# Patient Record
Sex: Female | Born: 1935 | Race: White | Hispanic: No | Smoking: Never smoker
Health system: Southern US, Community
[De-identification: ages and names within clinical notes are randomized; demographics above are authoritative.]

## PROBLEM LIST (undated history)

## (undated) DIAGNOSIS — R001 Bradycardia, unspecified: Secondary | ICD-10-CM

## (undated) DIAGNOSIS — I2510 Atherosclerotic heart disease of native coronary artery without angina pectoris: Secondary | ICD-10-CM

## (undated) DIAGNOSIS — M1990 Unspecified osteoarthritis, unspecified site: Secondary | ICD-10-CM

## (undated) DIAGNOSIS — IMO0002 Reserved for concepts with insufficient information to code with codable children: Secondary | ICD-10-CM

## (undated) DIAGNOSIS — Z87440 Personal history of urinary (tract) infections: Secondary | ICD-10-CM

## (undated) DIAGNOSIS — K279 Peptic ulcer, site unspecified, unspecified as acute or chronic, without hemorrhage or perforation: Secondary | ICD-10-CM

## (undated) DIAGNOSIS — R9430 Abnormal result of cardiovascular function study, unspecified: Secondary | ICD-10-CM

## (undated) DIAGNOSIS — C50919 Malignant neoplasm of unspecified site of unspecified female breast: Secondary | ICD-10-CM

## (undated) DIAGNOSIS — I10 Essential (primary) hypertension: Secondary | ICD-10-CM

## (undated) DIAGNOSIS — I219 Acute myocardial infarction, unspecified: Secondary | ICD-10-CM

## (undated) DIAGNOSIS — E785 Hyperlipidemia, unspecified: Secondary | ICD-10-CM

## (undated) DIAGNOSIS — K219 Gastro-esophageal reflux disease without esophagitis: Secondary | ICD-10-CM

## (undated) DIAGNOSIS — R0602 Shortness of breath: Secondary | ICD-10-CM

## (undated) DIAGNOSIS — Z9861 Coronary angioplasty status: Secondary | ICD-10-CM

## (undated) DIAGNOSIS — C801 Malignant (primary) neoplasm, unspecified: Secondary | ICD-10-CM

## (undated) DIAGNOSIS — Z8711 Personal history of peptic ulcer disease: Secondary | ICD-10-CM

## (undated) HISTORY — DX: Abnormal result of cardiovascular function study, unspecified: R94.30

## (undated) HISTORY — PX: CHOLECYSTECTOMY: SHX55

## (undated) HISTORY — DX: Atherosclerotic heart disease of native coronary artery without angina pectoris: I25.10

## (undated) HISTORY — DX: Unspecified osteoarthritis, unspecified site: M19.90

## (undated) HISTORY — DX: Peptic ulcer, site unspecified, unspecified as acute or chronic, without hemorrhage or perforation: K27.9

## (undated) HISTORY — DX: Essential (primary) hypertension: I10

## (undated) HISTORY — DX: Reserved for concepts with insufficient information to code with codable children: IMO0002

## (undated) HISTORY — DX: Bradycardia, unspecified: R00.1

## (undated) HISTORY — DX: Hyperlipidemia, unspecified: E78.5

---

## 1973-05-08 HISTORY — PX: THYROIDECTOMY: SHX17

## 1996-05-08 DIAGNOSIS — Z9861 Coronary angioplasty status: Secondary | ICD-10-CM

## 1996-05-08 DIAGNOSIS — I219 Acute myocardial infarction, unspecified: Secondary | ICD-10-CM

## 1996-05-08 HISTORY — PX: CARDIAC CATHETERIZATION: SHX172

## 1996-05-08 HISTORY — DX: Coronary angioplasty status: Z98.61

## 1996-05-08 HISTORY — DX: Acute myocardial infarction, unspecified: I21.9

## 1997-10-30 ENCOUNTER — Ambulatory Visit (HOSPITAL_COMMUNITY): Admission: RE | Admit: 1997-10-30 | Discharge: 1997-10-30 | Payer: Self-pay | Admitting: *Deleted

## 1998-04-20 ENCOUNTER — Ambulatory Visit (HOSPITAL_COMMUNITY): Admission: RE | Admit: 1998-04-20 | Discharge: 1998-04-20 | Payer: Self-pay | Admitting: *Deleted

## 1998-10-15 ENCOUNTER — Ambulatory Visit (HOSPITAL_COMMUNITY): Admission: RE | Admit: 1998-10-15 | Discharge: 1998-10-15 | Payer: Self-pay | Admitting: Obstetrics & Gynecology

## 1999-04-22 ENCOUNTER — Ambulatory Visit (HOSPITAL_COMMUNITY): Admission: RE | Admit: 1999-04-22 | Discharge: 1999-04-22 | Payer: Self-pay | Admitting: Obstetrics & Gynecology

## 1999-10-21 ENCOUNTER — Ambulatory Visit (HOSPITAL_COMMUNITY): Admission: RE | Admit: 1999-10-21 | Discharge: 1999-10-21 | Payer: Self-pay | Admitting: Obstetrics & Gynecology

## 2000-11-23 ENCOUNTER — Ambulatory Visit (HOSPITAL_COMMUNITY): Admission: RE | Admit: 2000-11-23 | Discharge: 2000-11-23 | Payer: Self-pay

## 2001-12-27 ENCOUNTER — Ambulatory Visit (HOSPITAL_COMMUNITY): Admission: RE | Admit: 2001-12-27 | Discharge: 2001-12-27 | Payer: Self-pay | Admitting: *Deleted

## 2003-01-16 ENCOUNTER — Ambulatory Visit (HOSPITAL_COMMUNITY): Admission: RE | Admit: 2003-01-16 | Discharge: 2003-01-16 | Payer: Self-pay | Admitting: *Deleted

## 2004-03-15 ENCOUNTER — Ambulatory Visit: Payer: Self-pay | Admitting: Family Medicine

## 2004-04-20 ENCOUNTER — Ambulatory Visit: Payer: Self-pay | Admitting: Family Medicine

## 2004-04-22 ENCOUNTER — Ambulatory Visit: Payer: Self-pay | Admitting: Cardiology

## 2004-04-26 ENCOUNTER — Ambulatory Visit: Payer: Self-pay | Admitting: Family Medicine

## 2004-05-04 ENCOUNTER — Ambulatory Visit: Payer: Self-pay

## 2004-05-04 ENCOUNTER — Encounter: Payer: Self-pay | Admitting: Cardiology

## 2004-05-08 HISTORY — PX: OTHER SURGICAL HISTORY: SHX169

## 2004-05-08 HISTORY — PX: FRACTURE SURGERY: SHX138

## 2004-06-28 ENCOUNTER — Ambulatory Visit: Payer: Self-pay | Admitting: Cardiology

## 2005-03-01 ENCOUNTER — Ambulatory Visit: Payer: Self-pay | Admitting: Family Medicine

## 2005-03-03 ENCOUNTER — Ambulatory Visit: Payer: Self-pay | Admitting: Family Medicine

## 2005-04-19 ENCOUNTER — Ambulatory Visit: Payer: Self-pay | Admitting: Family Medicine

## 2005-04-25 ENCOUNTER — Ambulatory Visit: Payer: Self-pay | Admitting: Cardiology

## 2005-05-23 ENCOUNTER — Ambulatory Visit: Payer: Self-pay | Admitting: Family Medicine

## 2006-04-27 ENCOUNTER — Ambulatory Visit: Payer: Self-pay | Admitting: Cardiology

## 2007-04-17 ENCOUNTER — Ambulatory Visit: Payer: Self-pay | Admitting: Cardiology

## 2008-04-27 ENCOUNTER — Ambulatory Visit: Payer: Self-pay | Admitting: Cardiology

## 2010-09-20 NOTE — Assessment & Plan Note (Signed)
[facility]                            CARDIOLOGY OFFICE NOTE   NAME:LEVINERArmanii, Urbanik                      MRN:          [id number]  DATE:04/27/2008                            DOB:          15-Jan-1935    Ms. Dirocco continues to do well.  She has known coronary disease.  She  had an MI with an intervention in 1998.  She has been stable since then.  She had an exercise test in 2005, with no marked abnormalities.  Originally, she had pain in her left arm and shoulder.  She is not  having any of this.  She has some discomfort in her right shoulder at  times, but this sounds musculoskeletal.  She has not had any syncope or  presyncope.   PAST MEDICAL HISTORY:   ALLERGIES:  No known drug allergies.   MEDICATIONS:  See the flow sheet.   REVIEW OF SYSTEMS:  She is not having any GI or GU symptoms.  She has no  fevers or chills or headaches.  Her review of systems otherwise is  negative.   PHYSICAL EXAMINATION:  VITAL SIGNS:  Blood pressure is 124/74 with the  pulse of 50.  The patient is oriented to person, time, and place.  Affect is normal.  HEENT:  No xanthelasma.  She has normal extraocular motion.  There are  no carotid bruits.  There is no jugular venous distention.  LUNGS:  Clear.  Respiratory effort is not labored.  CARDIAC:  S1 with an S2.  There are no clicks or significant murmurs.  ABDOMEN:  Soft.  EXTREMITIES:  She has no peripheral edema.   EKG reveals sinus bradycardia.  I have reviewed it completely.   PROBLEMS:  Listed on my note of April 17, 2007.  #2.  Coronary disease stable.  There will be time to do an exercise test  when we see her next year.  #5.  Good left ventricular function.  #8.  Hyperlipidemia, followed by Dr. Sol Passer.  She is on high-dose  Lipitor.   She is stable.  I will see her back in 1 year.     Luis Abed, MD, [facility]  Electronically Signed   JDK/MedQ  DD: 04/27/2008  DT: 04/27/2008  Job #:  [id number]   cc:   Dina Rich
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
## 2011-01-27 ENCOUNTER — Encounter: Payer: Self-pay | Admitting: Cardiology

## 2011-01-27 ENCOUNTER — Encounter: Payer: Self-pay | Admitting: *Deleted

## 2011-01-27 DIAGNOSIS — R001 Bradycardia, unspecified: Secondary | ICD-10-CM | POA: Insufficient documentation

## 2011-01-27 DIAGNOSIS — E785 Hyperlipidemia, unspecified: Secondary | ICD-10-CM | POA: Insufficient documentation

## 2011-01-27 DIAGNOSIS — J45909 Unspecified asthma, uncomplicated: Secondary | ICD-10-CM | POA: Insufficient documentation

## 2011-01-30 ENCOUNTER — Encounter: Payer: Self-pay | Admitting: Cardiology

## 2011-01-30 ENCOUNTER — Ambulatory Visit (INDEPENDENT_AMBULATORY_CARE_PROVIDER_SITE_OTHER): Payer: Medicare Other | Admitting: Cardiology

## 2011-01-30 VITALS — BP 160/98 | HR 55 | Ht 61.0 in | Wt 139.0 lb

## 2011-01-30 DIAGNOSIS — R001 Bradycardia, unspecified: Secondary | ICD-10-CM

## 2011-01-30 DIAGNOSIS — E785 Hyperlipidemia, unspecified: Secondary | ICD-10-CM

## 2011-01-30 DIAGNOSIS — I2510 Atherosclerotic heart disease of native coronary artery without angina pectoris: Secondary | ICD-10-CM

## 2011-01-30 DIAGNOSIS — I10 Essential (primary) hypertension: Secondary | ICD-10-CM

## 2011-01-30 DIAGNOSIS — I498 Other specified cardiac arrhythmias: Secondary | ICD-10-CM

## 2011-01-30 MED ORDER — LISINOPRIL 10 MG PO TABS: 10.0000 mg | ORAL_TABLET | Freq: Every day | ORAL | Status: DC

## 2011-01-30 NOTE — Assessment & Plan Note (Signed)
Patient has mild resting bradycardia.  No change in therapy.

## 2011-01-30 NOTE — Assessment & Plan Note (Signed)
Blood pressure is elevated in the office today.  I personally repeated the blood pressure did remain the same in the range of 160/85.  She had been on lisinopril in the past and tolerated this well.  I feel it would be prudent for her to be back on 10 mg of lisinopril and this will be started.
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
## 2011-01-30 NOTE — Assessment & Plan Note (Signed)
Patient has known coronary disease with an intervention in 1998.  Nuclear exercise test in 2005 revealed no ischemia.  It is now 7 years since her last stress study.  I have arranged for a stress echo.  She says that she can walk on the treadmill.  Hopefully we will get both exercise data and LV function will be helpful.

## 2011-01-30 NOTE — Patient Instructions (Signed)
Your physician recommends that you schedule a follow-up appointment in: 5 weeks with Dr. Myrtis Ser. New medication Lisinopril 10 mg take one tablet by mouth daily. Your physician has requested that you have a stress echocardiogram. For further information please visit [email or web address] Please follow instruction sheet as given.

## 2011-01-30 NOTE — Assessment & Plan Note (Signed)
I have a copy of the patient's most recent lipids.  Her total cholesterol was 178.  LDL was 94 and HDL 72.  This is on Pravachol.  Since she has documented coronary disease consideration could be given to pushing her meds prior to get her LDL little lower.

## 2011-02-10 ENCOUNTER — Ambulatory Visit (HOSPITAL_BASED_OUTPATIENT_CLINIC_OR_DEPARTMENT_OTHER): Payer: Medicare Other | Admitting: Radiology

## 2011-02-10 ENCOUNTER — Ambulatory Visit (HOSPITAL_COMMUNITY): Payer: Medicare Other | Attending: Cardiology | Admitting: Radiology

## 2011-02-10 DIAGNOSIS — I2510 Atherosclerotic heart disease of native coronary artery without angina pectoris: Secondary | ICD-10-CM | POA: Insufficient documentation

## 2011-02-10 DIAGNOSIS — R0989 Other specified symptoms and signs involving the circulatory and respiratory systems: Secondary | ICD-10-CM

## 2011-03-06 ENCOUNTER — Ambulatory Visit: Payer: Medicare Other | Admitting: Cardiology

## 2011-03-06 ENCOUNTER — Encounter: Payer: Self-pay | Admitting: Cardiology

## 2011-03-06 DIAGNOSIS — I2510 Atherosclerotic heart disease of native coronary artery without angina pectoris: Secondary | ICD-10-CM | POA: Insufficient documentation

## 2011-03-09 ENCOUNTER — Ambulatory Visit: Payer: Medicare Other | Admitting: Cardiology

## 2011-03-28 ENCOUNTER — Ambulatory Visit: Payer: Medicare Other | Admitting: Cardiology

## 2011-04-09 ENCOUNTER — Encounter: Payer: Self-pay | Admitting: Cardiology

## 2011-04-09 DIAGNOSIS — R9430 Abnormal result of cardiovascular function study, unspecified: Secondary | ICD-10-CM | POA: Insufficient documentation

## 2011-04-10 ENCOUNTER — Encounter: Payer: Self-pay | Admitting: Cardiology

## 2011-04-10 ENCOUNTER — Ambulatory Visit (INDEPENDENT_AMBULATORY_CARE_PROVIDER_SITE_OTHER): Payer: Medicare Other | Admitting: Cardiology

## 2011-04-10 DIAGNOSIS — I10 Essential (primary) hypertension: Secondary | ICD-10-CM

## 2011-04-10 DIAGNOSIS — I2510 Atherosclerotic heart disease of native coronary artery without angina pectoris: Secondary | ICD-10-CM

## 2011-04-10 DIAGNOSIS — R001 Bradycardia, unspecified: Secondary | ICD-10-CM

## 2011-04-10 DIAGNOSIS — I498 Other specified cardiac arrhythmias: Secondary | ICD-10-CM

## 2011-04-10 NOTE — Patient Instructions (Signed)
Your physician wants you to follow-up in: one year You will receive a reminder letter in the mail two months in advance. If you don't receive a letter, please call our office to schedule the follow-up appointment.  

## 2011-04-10 NOTE — Assessment & Plan Note (Signed)
Patient has mild bradycardia that is chronic.  She has no symptoms.  No change in therapy.

## 2011-04-10 NOTE — Assessment & Plan Note (Signed)
Hypertension is now under good control she needs no further workup.
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
## 2011-04-10 NOTE — Assessment & Plan Note (Signed)
Coronary disease is stable she needs no further workup at this time.

## 2011-04-25 ENCOUNTER — Other Ambulatory Visit: Payer: Self-pay

## 2011-04-26 ENCOUNTER — Other Ambulatory Visit: Payer: Self-pay | Admitting: Radiology

## 2011-04-26 DIAGNOSIS — C50912 Malignant neoplasm of unspecified site of left female breast: Secondary | ICD-10-CM

## 2011-04-28 ENCOUNTER — Other Ambulatory Visit: Payer: Self-pay | Admitting: Cardiology

## 2011-05-04 ENCOUNTER — Ambulatory Visit
Admission: RE | Admit: 2011-05-04 | Discharge: 2011-05-04 | Disposition: A | Discharge status: Home / Self Care | Payer: Medicare Other | Source: Ambulatory Visit | Attending: Radiology | Admitting: Radiology

## 2011-05-04 DIAGNOSIS — C50912 Malignant neoplasm of unspecified site of left female breast: Secondary | ICD-10-CM

## 2011-05-04 MED ORDER — GADOBENATE DIMEGLUMINE 529 MG/ML IV SOLN
12.0000 mL | Freq: Once | INTRAVENOUS | Status: AC | PRN
  Administered 2011-05-04: 12 mL via INTRAVENOUS

## 2011-05-09 DIAGNOSIS — C801 Malignant (primary) neoplasm, unspecified: Secondary | ICD-10-CM

## 2011-05-09 HISTORY — PX: BREAST SURGERY: SHX581

## 2011-05-09 HISTORY — DX: Malignant (primary) neoplasm, unspecified: C80.1

## 2011-05-10 ENCOUNTER — Ambulatory Visit (HOSPITAL_BASED_OUTPATIENT_CLINIC_OR_DEPARTMENT_OTHER): Payer: Medicare Other

## 2011-05-10 ENCOUNTER — Ambulatory Visit (HOSPITAL_BASED_OUTPATIENT_CLINIC_OR_DEPARTMENT_OTHER): Payer: Medicare HMO | Admitting: Surgery

## 2011-05-10 ENCOUNTER — Ambulatory Visit: Payer: Medicare Other

## 2011-05-10 ENCOUNTER — Other Ambulatory Visit: Payer: Self-pay | Admitting: Radiology

## 2011-05-10 ENCOUNTER — Ambulatory Visit (HOSPITAL_BASED_OUTPATIENT_CLINIC_OR_DEPARTMENT_OTHER): Payer: Medicare HMO | Admitting: Oncology

## 2011-05-10 ENCOUNTER — Encounter: Payer: Self-pay | Admitting: Specialist

## 2011-05-10 ENCOUNTER — Other Ambulatory Visit: Payer: Self-pay | Admitting: *Deleted

## 2011-05-10 ENCOUNTER — Ambulatory Visit
Admission: RE | Admit: 2011-05-10 | Discharge: 2011-05-10 | Disposition: A | Discharge status: Home / Self Care | Payer: Medicare HMO | Source: Ambulatory Visit | Attending: Radiation Oncology | Admitting: Radiation Oncology

## 2011-05-10 ENCOUNTER — Ambulatory Visit: Payer: Medicare HMO | Attending: Surgery | Admitting: Physical Therapy

## 2011-05-10 ENCOUNTER — Encounter (INDEPENDENT_AMBULATORY_CARE_PROVIDER_SITE_OTHER): Payer: Self-pay | Admitting: Surgery

## 2011-05-10 ENCOUNTER — Other Ambulatory Visit: Payer: Medicare Other | Admitting: Lab

## 2011-05-10 VITALS — BP 124/74 | HR 60 | Temp 98.9°F | Resp 20 | Ht 59.5 in | Wt 133.1 lb

## 2011-05-10 VITALS — BP 124/74 | HR 60 | Temp 98.9°F | Ht 59.5 in | Wt 133.1 lb

## 2011-05-10 DIAGNOSIS — IMO0001 Reserved for inherently not codable concepts without codable children: Secondary | ICD-10-CM | POA: Insufficient documentation

## 2011-05-10 DIAGNOSIS — C50919 Malignant neoplasm of unspecified site of unspecified female breast: Secondary | ICD-10-CM | POA: Insufficient documentation

## 2011-05-10 DIAGNOSIS — Z01818 Encounter for other preprocedural examination: Secondary | ICD-10-CM | POA: Insufficient documentation

## 2011-05-10 DIAGNOSIS — R293 Abnormal posture: Secondary | ICD-10-CM | POA: Insufficient documentation

## 2011-05-10 DIAGNOSIS — R928 Other abnormal and inconclusive findings on diagnostic imaging of breast: Secondary | ICD-10-CM

## 2011-05-10 HISTORY — DX: Malignant neoplasm of unspecified site of unspecified female breast: C50.919

## 2011-05-10 LAB — CBC WITH DIFFERENTIAL/PLATELET
Basophils Absolute: 0 10*3/uL (ref 0.0–0.1)
EOS%: 3.2 % (ref 0.0–7.0)
Eosinophils Absolute: 0.2 10*3/uL (ref 0.0–0.5)
HGB: 12.9 g/dL (ref 11.6–15.9)
MCH: 29 pg (ref 25.1–34.0)
NEUT#: 3.2 10*3/uL (ref 1.5–6.5)
RBC: 4.45 10*6/uL (ref 3.70–5.45)
RDW: 15.2 % — ABNORMAL HIGH (ref 11.2–14.5)
lymph#: 1.4 10*3/uL (ref 0.9–3.3)

## 2011-05-10 LAB — COMPREHENSIVE METABOLIC PANEL
ALT: 14 U/L (ref 0–35)
AST: 20 U/L (ref 0–37)
Albumin: 4.2 g/dL (ref 3.5–5.2)
BUN: 16 mg/dL (ref 6–23)
Calcium: 10.3 mg/dL (ref 8.4–10.5)
Chloride: 101 mEq/L (ref 96–112)
Potassium: 3.9 mEq/L (ref 3.5–5.3)
Sodium: 140 mEq/L (ref 135–145)
Total Protein: 7.2 g/dL (ref 6.0–8.3)

## 2011-05-10 NOTE — Patient Instructions (Signed)
Awaiting results of biopsy.  Return to [facility] to see Dr Luisa Hart after biopsy done.
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
## 2011-05-10 NOTE — Progress Notes (Signed)
Met pt at multidisciplinary clinic.  Pt was accompanied by her two daughters, one of whom is a breast cancer survivor.  I gave pt information on all the support programs and services, as well as my contact information.  Pt did not request a Reach to Recovery referral at this time.  TMP
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
## 2011-05-12 ENCOUNTER — Encounter: Payer: Self-pay | Admitting: *Deleted

## 2011-05-12 NOTE — Progress Notes (Signed)
Mailed after appt letter to pt. 

## 2011-05-15 ENCOUNTER — Telehealth: Payer: Self-pay | Admitting: *Deleted

## 2011-05-16 ENCOUNTER — Ambulatory Visit
Admission: RE | Admit: 2011-05-16 | Discharge: 2011-05-16 | Disposition: A | Discharge status: Home / Self Care | Payer: Medicare HMO | Source: Ambulatory Visit | Attending: Radiology | Admitting: Radiology

## 2011-05-16 ENCOUNTER — Telehealth: Payer: Self-pay | Admitting: *Deleted

## 2011-05-16 DIAGNOSIS — R928 Other abnormal and inconclusive findings on diagnostic imaging of breast: Secondary | ICD-10-CM

## 2011-05-16 MED ORDER — GADOBENATE DIMEGLUMINE 529 MG/ML IV SOLN
12.0000 mL | Freq: Once | INTRAVENOUS | Status: AC | PRN
  Administered 2011-05-16: 12 mL via INTRAVENOUS

## 2011-05-16 NOTE — Telephone Encounter (Signed)
Spoke to pt concerning [facility] from 05/10/11.  Pt denies questions at this time.  Pt relate she had 2nd MRI bx today and will see Dr. Luisa Hart on 05/19/11 to discuss further treatment options.  Encourage pt to call with needs or concerns.  Received verbal understanding.  Contact information given.  Pt did want to speak to a financial counselor to discuss insurance coverage and some financial concerns.  Spoke to [place] in financial counseling about pt concerns.  Gave her pt information and best numbers to reach pt.

## 2011-05-16 NOTE — Telephone Encounter (Signed)
Left vm for pt to return call concerning BMDC from 05/10/11.

## 2011-05-19 ENCOUNTER — Encounter (INDEPENDENT_AMBULATORY_CARE_PROVIDER_SITE_OTHER): Payer: Self-pay | Admitting: Surgery

## 2011-05-19 ENCOUNTER — Ambulatory Visit (INDEPENDENT_AMBULATORY_CARE_PROVIDER_SITE_OTHER): Payer: Medicare HMO | Admitting: Surgery

## 2011-05-19 VITALS — BP 112/70 | HR 68 | Temp 97.2°F | Resp 16 | Ht 61.0 in | Wt 135.4 lb

## 2011-05-19 DIAGNOSIS — C50919 Malignant neoplasm of unspecified site of unspecified female breast: Secondary | ICD-10-CM

## 2011-05-19 NOTE — Patient Instructions (Signed)
Mastectomy, With or Without Reconstruction Mastectomy (removal of the breast) is a procedure most commonly used to treat cancer (tumor) of the breast. Different procedures are available for treatment. This depends on the stage of the tumor (abnormal growths). Discuss this with your caregiver, surgeon (a specialist for performing operations such as this), or oncologist (someone specialized in the treatment of cancer). With proper information, you can decide which treatment is best for you. Although the sound of the word cancer is frightening to all of [place], the new treatments and medications can be a source of reassurance and comfort. If there are things you are worried about, discuss them with your caregiver. He or she can help comfort you and your family. Some of the different procedures for treating breast cancer are:  Radical (extensive) mastectomy. This is an operation used to remove the entire breast, the muscles under the breast, and all of the glands (lymph nodes) under the arm. With all of the new treatments available for cancer of the breast, this procedure has become less common.   Modified radical mastectomy. This is a similar operation to the radical mastectomy described above. In the modified radical mastectomy, the muscles of the chest wall are not removed unless one of the lessor muscles is removed. One of the lessor muscles may be removed to allow better removal of the lymph nodes. The axillary lymph nodes are also removed. Rarely, during an axillary node dissection nerves to this area are damaged. Radiation therapy is then often used to the area following this surgery.   A total mastectomy also known as a complete or simple mastectomy. It involves removal of only the breast. The lymph nodes and the muscles are left in place.   In a lumpectomy, the lump is removed from the breast. This is the simplest form of surgical treatment. A sentinel lymph node biopsy may also be done. Additional  treatment may be required.  RISKS AND COMPLICATIONS The main problems that follow removal of the breast include:  Infection (germs start growing in the wound). This can usually be treated with antibiotics (medications that kill germs).   Lymphedema. This means the arm on the side of the breast that was operated on swells because the lymph (tissue fluid) cannot follow the main channels back into the body. This only occurs when the lymph nodes have had to be removed under the arm.   There may be some areas of numbness to the upper arm and around the incision (cut by the surgeon) in the breast. This happens because of the cutting of or damage to some of the nerves in the area. This is most often unavoidable.   There may be difficultymoving the arm in a full range of motion (moving in all directions) following surgery. This usually improves with time following use and exercise.   Recurrence of breast cancer may happen with the very best of surgery and follow up treatment. Sometimes small cancer cells that cannot be seen with the naked eye have already spread at the time of surgery. When this happens other treatment is available. This treatment may be radiation, medications or a combination of both.  RECONSTRUCTION Reconstruction of the breast may be done immediately if there is not going to be post-operative radiation. This surgery is done for cosmetic (improve appearance) purposes to improve the physical appearance after the operation. This may be done in two ways:  It can be done using a saline filled prosthetic (an artificial breast which is filled  with salt water). Silicone breast implants are now re-approved by the FDA and are being commonly used.   Reconstruction can be done using the body's own muscle/fat/skin.  Your caregiver will discuss your options with you. Depending upon your needs or choice, together you will be able to determine which procedure is best for you. Document Released:  01/17/2001 Document Revised: 01/04/2011 Document Reviewed: 09/10/2007 [facility] Patient Information 2012 [place], [place]. Exercises Following Breast Surgery Following all surgeries on the breast or axillary nodes, whether you had radiation treatment or not, exercises may help you return to your normal activities and way of life sooner. Before beginning any exercise, talk to your caregiver about what type of exercises will be best for you. Your caregiver may recommend getting physical therapy to help you, especially if you do not see any progress in a month of exercising. Some light exercises can be done right after the surgery, but any drains and sutures will be removed before doing the extended or heavy exercises. Generally, the exercises will lessen problems following the surgery. You can usually expect to have full range of motion of your arm back in 4 to 6 weeks.  HOME CARE INSTRUCTIONS  These exercises should be done for the first 3 to 7 days after surgery, but only with your doctor's permission.   Use your affected arm (on the side where your surgery was) as you normally would when combing your hair, bathing, dressing and eating.   Raise your affected arm above the level of your heart for 45 minutes, 2 to 3 times a day, while lying down. Put your arm on pillows so that your hand is higher than your wrist and your elbow is a little higher than your shoulder. This will help decrease the swelling that may happen after surgery.   Exercise your affected arm while it is elevated above the level of your heart by opening and closing your hand 15 to 25 times. Then, bend and straighten your elbow. Repeat this 3 to 4 times a day. This exercise helps reduce swelling by pumping lymph fluid out of your arm.   Practice deep breathing exercises (using your diaphragm) at least 6 times each day. While lying on your back, take a slow, deep breath. Breathe in as much air as you can while trying to expand your chest  and abdomen (push your belly button away from your spine). Relax and breathe out. Repeat this 4 or 5 times. This exercise will help maintain normal movement of your chest, making it easier for your lungs to expand. Continue to do deep breathing exercises from now on.   Avoid sleeping on your affected arm or on that side.   Do not lift anything over 5 pounds.   Stop exercising if you develop pain in your chest, arm or shoulder, and call your caregiver.   Let your caregiver or therapist know if your arm becomes swollen after exercising.   Exercise in front of a mirror. This way you can see yourself exercising in a correct posture and using the correct motion that is recommended.   Do not use a heating pad on your arm of the side of the surgery.  GENERAL GUIDELINES FOR EXERCISE The exercises described here can be done as soon as your doctor gives you permission. Be sure to talk to your doctor before trying any of them.   You will feel some tightness in your chest and armpit after surgery. This is normal. The tightness will decrease  as you continue your exercise program.   Many women have a burning, tingling, numbness, or soreness on the back of the arm and/or chest wall. This is because the surgery irritated some of your nerve endings. Although the sensations may increase a few weeks after surgery, continue to do the exercises unless you notice unusual swelling or tenderness. (Tell your caregiver if this occurs.) Sometimes rubbing or stroking the area with your hand or a soft cloth can help make the area less sensitive.   It may be helpful to do exercises after a warm shower when muscles are warm and relaxed.   Wear comfortable, loose clothing when doing the exercises.   Do the exercises until you feel a slow stretch. Hold each stretch at the end of the motion for a count of five. It is normal to feel some pulling as you stretch the skin and muscles that have been shortened because of the  surgery. Do not do bouncing or jerky-type movements when doing any of the exercises. You should not feel pain as you do the exercises, only gentle stretching.   Do 5 to 7 repetitions of each exercise. Try to do each exercise correctly. If you have difficulty with the exercises, contact your doctor. You may need to be referred to a physical or occupational therapist.   Exercises should be done twice a day until you regain normal flexibility and strength.   Be sure to take deep breaths, in and out, as you perform each exercise.   The exercises are designed so that you begin them lying down, move to sitting, and then finish standing.  EXERCISES IN LYING POSITION These exercises should be performed on a bed or on the floor while lying on your back. Keep your knees and hips bent and feet flat.  Wand Exercise This exercise helps increase the forward motion of the shoulders. You will need a broom handle, yardstick, or other similar object to perform this exercise.   Hold the wand in both hands with palms facing up.   Lift the wand up over your head (as far as you can) using your unaffected arm to help lift the wand, until you feel a stretch in your affected arm.   Hold for five seconds.   Lower arms and repeat 5 to 7 times.  Elbow Winging This exercise helps increase the mobility of the front of your chest and shoulder. It may take several weeks of regular exercise before your elbows will get close to the bed (or floor).   Clasp your hands behind your neck with your elbows pointing toward the ceiling.   Move your elbows apart and down toward the bed (or floor).   Repeat 5 to 7 times.  EXERCISES IN SITTING POSITION Shoulder Blade Stretch This exercise helps increase the mobility of the shoulder blades.   Sit in a chair very close to a table.   Place the unaffected arm on the table with your elbow bent and palm down. Do not move this arm during the exercise.   Place the affected arm on  the table, palm down with your elbow straight.   Without moving your trunk, slide the affected arm toward the opposite side of the table. You should feel your shoulder blade move as you do this.   Relax your arm and repeat 5 to 7 times.  Shoulder Blade Squeeze This exercise also helps increase the mobility of the shoulder blade.   Facing straight ahead, sit in a chair  in front of a mirror without resting on the back of the chair.   Arms should be at your sides with elbows bent.   Squeeze shoulder blades together, bringing your elbows behind you. Keep your shoulders level as you do this exercise. Do not lift your shoulders up toward your ears.   Return to the starting position and repeat 5 to 7 times.  Side Bending This exercise helps increase the mobility of the trunk/body.   Clasp your hands together in front of you and lift your arms slowly over your head, straightening your arms.   When your arms are over your head, bend your trunk to the right while bending at the waist and keeping your arms overhead.   Return to the starting position and bend to the left.   Repeat 5 to 7 times.  EXERCISES IN STANDING POSITION Chest Wall Stretch This exercise helps stretch the chest wall.   Stand facing a corner with toes approximately 8 to 10 inches from the corner.   Bend your elbows and place forearms on the wall, one on each side of the corner. Your elbows should be as close to shoulder height as possible.   Keep your arms and feet in position and move your chest toward the corner. You will feel a stretch across your chest and shoulders.   Return to starting position and repeat 5 to 7 times.  Shoulder Stretch This exercise helps increase the mobility in the shoulder.   Stand facing the wall with your toes approximately 8 to 10 inches from the wall.   Place your hands on the wall. Use your fingers to "climb the wall," reaching as high as you can until you feel a stretch.   Return to  starting position and repeat 5 to 7 times.  THINGS TO KEEP IN MIND  Begin exercising slowly and keep going as you are able. Stop exercising and call your caregiver if you:  Get weaker, start losing your balance or start falling.   Have pain that gets worse.   Have new heaviness in your arm.   Have unusual swelling, or swelling gets worse.   Have headaches, dizziness, blurred vision, new numbness or tingling in arms or chest.  It is important to exercise to keep muscles working as well as possible, but it is also important to be safe. Talk with your caregiver about realistic exercises for your condition. Then set goals for increasing your physical activity level.  Document Released: 11/14/2005 Document Revised: 01/04/2011 Document Reviewed: 06/08/2008 [facility] Patient Information 2012 [place], [place]. Segmental Mastectomy and Axillary Lymph Node Dissection     Your options are bilateral lumpectomy,  Mastectomy with reconstruction or chemotherapy first with surgery later.  ALL ARE GOOD OPTIONS. Will refer to Dr Gilman Buttner and Dr Odis Luster. Return to see me in 6 months if you take chemotherapy first. Care After These discharge instructions provide you with general information on caring for yourself after you leave the hospital. While your treatment has been planned according to the most current medical practices available, unavoidable complications occasionally occur. It is important that you know the warning signs of complications so that you can seek treatment. Please read the instructions outlined below and refer to this sheet in the next few weeks. Your doctor may also give you specific information. If you have any complications or questions after discharge, please call your doctor.  ACTIVITY  Your doctor will tell you when you may resume strenuous activities, driving and sports. After  the drain(s) are removed, you may do light housework, but avoid heavy lifting, carrying or pushing (you  should not be lifting anything heavier than 5 lbs.).   Take frequent rest periods. You may tire more easily than usual. Always rest and elevate the arm affected by your surgery for a period of time equal to your activity time.   Continue doing the exercises given to you by the physical therapist/occupational therapist even after full range of motion has returned. The amount of time this takes will vary from person to person. Generally, after normal range of motion has returned, some stiffness and soreness may persist for 2-3 months. This is normal and should subside.   Begin sports or strenuous activities in moderation, giving you a chance to rebuild your endurance.   Continue to be cautious of heavy lifting or carrying (not more than 10 lbs.) with the affected arm. You may return to work as recommended by your doctor.  NUTRITION  You may resume your normal diet.   Make sure you drink plenty of fluids (6-8 glasses per day).   Eat a well-balanced diet.   Daily portions of food from the grains, vegetables, fruits, milk, and meat and beans families are necessary for your health. You may visit [email or web address] for more dietary information.  HYGIENE  You may wash your hair.   If your incision (the cut from surgery) is healed, you may shower or take a bath, unless instructed otherwise by your caregiver.  FEVER If you feel feverish or have shaking chills, take your temperature. If your temperature is 102 F (38.9 C) or above, call your caregiver. The fever may mean there is an infection. If you call early, infection can be treated with antibiotics and hospitalization may be avoided. PAIN CONTROL  Mild discomfort may occur. You may need to take an "over-the-counter" pain medication or a medication prescribed by your doctor.   Call your doctor if you experience increased pain.  INCISION CARE Check your incision daily for increased redness, drainage, swelling or separation of skin  edges. Call your doctor if any of the above are noted. DRAIN CARE If you have a drain in place, ask for instructions for "Surgical Drain Care". ARM AND HAND CARE  Since the lymph nodes under your arm have been removed, there may be a greater chance for the arm to swell.   Avoid, if possible, having blood pressures taken, blood drawn or injections given in the affected arm.   Use hand lotion to soften fingernail cuticles instead of cutting them to avoid cutting yourself.   Use an electric shaver to shave your underarms.   You may use a deodorant after the incision has completely healed.   Be careful not to cut yourself when cooking, sewing or gardening.   Do not weigh your arm straight down with a package or your purse.  Note: Follow the exercises and instructions given to you by the physical therapist/occupational therapist and your treating doctor.  Document Released: 12/07/2003 Document Revised: 01/04/2011 Document Reviewed: 07/16/2007 [facility] Patient Information 2012 [place], [place].

## 2011-05-19 NOTE — Progress Notes (Signed)
The patient returns today. She had a newly diagnosed left breast cancer with maximal diameter of 3.9 cm located in the upper quadrant which is ER positive PR positive HER-2/neu negative with a low proliferation right. A second area in the left breast was found to be fibrocystic disease. 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 She is here today for followup and discussion of treatment options. The patient was not reexamined today. I spent 40 minutes discussing options. Bilateral breast conservation surgery with sentinel lymph mapping is one option. The second option is bilateral mastectomy with or without reconstruction. A third option is new adjuvant chemotherapy and surgery at a later date. The pros and cons of each were discussed. Surgical complications of each were discussed. She would like to pursue neoadjuvant chemotherapy. I will refer her to Dr Gilman Buttner.  I will refer her to talk to Dr. Odis Luster about plastic surgery. She will return to see me in 6 months.

## 2011-05-29 ENCOUNTER — Ambulatory Visit: Payer: Medicare HMO

## 2011-05-29 NOTE — Progress Notes (Signed)
Pt. Counseled re: BRCA testing for hereditary breast and ovarian cancer.  Pt. Sent to lab.

## 2011-06-05 ENCOUNTER — Other Ambulatory Visit: Payer: Self-pay | Admitting: Oncology

## 2011-06-16 ENCOUNTER — Telehealth: Payer: Self-pay | Admitting: Genetic Counselor

## 2011-06-16 ENCOUNTER — Encounter: Payer: Self-pay | Admitting: Genetic Counselor

## 2011-06-16 NOTE — Telephone Encounter (Signed)
Will discuss the [place] panel futher if BART testing is negative.

## 2011-06-21 ENCOUNTER — Telehealth: Payer: Self-pay | Admitting: Genetic Counselor

## 2011-06-21 NOTE — Telephone Encounter (Signed)
Negative Bart.  R/s for further testing.

## 2011-06-30 NOTE — Telephone Encounter (Signed)
xxx

## 2011-07-28 ENCOUNTER — Ambulatory Visit (INDEPENDENT_AMBULATORY_CARE_PROVIDER_SITE_OTHER): Payer: Medicare HMO | Admitting: Surgery

## 2011-07-28 ENCOUNTER — Encounter (INDEPENDENT_AMBULATORY_CARE_PROVIDER_SITE_OTHER): Payer: Self-pay | Admitting: Surgery

## 2011-07-28 VITALS — BP 152/84 | HR 72 | Ht 62.0 in | Wt 134.4 lb

## 2011-07-28 DIAGNOSIS — C50919 Malignant neoplasm of unspecified site of unspecified female breast: Secondary | ICD-10-CM

## 2011-07-28 DIAGNOSIS — D0510 Intraductal carcinoma in situ of unspecified breast: Secondary | ICD-10-CM

## 2011-07-28 DIAGNOSIS — D0590 Unspecified type of carcinoma in situ of unspecified breast: Secondary | ICD-10-CM

## 2011-07-28 HISTORY — DX: Intraductal carcinoma in situ of unspecified breast: D05.10

## 2011-07-28 NOTE — Patient Instructions (Signed)
Mastectomy Care After HOME CARE    Care for your wound after the bandages are off as told by your doctor.   Put soft padding such as gauze, soft cloth, or a nursing pad over your wound if you wear a bra.   Ask your doctor about groups that can help you with any emotions you may have after the surgery.   Exercise your arm and shoulder as told by your doctor.   Place your hands on a wall. Use your fingers to "climb the wall." Reach as high as you can until you feel a stretch. When you are not exercising, keep your arm raised (elevated).   When sitting or lying down, put your arm up on pillows or rolled blankets.   Do not use your arm to lift or push anything heavier than 10 pounds (about one gallon of milk ) for the first 6 weeks.   Always take good care of the arm on the side that the breast was removed.   Never let anyone take your blood pressure, draw blood, or give you a shot in that arm.   Do not get even a small cut on that arm or hand. Use a thimble when you sew. Wear heavy gloves when you garden.   Use insect repellent on that arm if outside.   Do not use a razor to shave that underarm. You should use only an electric shaver.   Do not burn that arm. Use a glove when you reach into the oven. Cover your arm with a towel or wear a long-sleeved shirt when you are out in the sun.   Wear your watch and other jewelry on the other arm.   Wear a loose fitting rubber glove when you wash the dishes. Do not leave your hand in water for a long time, especially when you use detergents.   Do not cut your cuticles or hang nails. Push cuticles back with a towel after you take a bath.   Carry your purse or any heavy objects in the other arm.  GET HELP RIGHT AWAY IF:   Your arm becomes very puffy (swollen).   You have redness or pain at the wound site.   There is a bad smell coming from the wound.   Thre is yellowish white fluid (pus) coming from your wound.   You have a fever.    MAKE SURE YOU:   Understand these instructions.   Will watch your condition.   Will get help right away if you are not doing well or get worse.  Document Released: 02/01/2008 Document Revised: 04/13/2011 Document Reviewed: 02/01/2008 [facility] Patient Information 2012 [place], [place].   Total or Modified Radical Mastectomy  Care After Refer to this sheet in the next few weeks. These instructions provide you with information on caring for yourself after your procedure. Your caregiver may also give you more specific instructions. Your treatment has been planned according to current medical practices, but problems sometimes occur. Call your caregiver if you have any problems or questions after your procedure. ACTIVITY  Your caregiver will advise you when you may resume strenuous activities, driving, and sports.   After the drain(s) are removed, you may do light housework. Avoid heavy lifting, carrying, or pushing. You should not be lifting anything heavier than 5 lbs.   Take frequent rest periods. You may tire more easily than usual.   Always rest and elevate the arm affected by your surgery for a period of time equal  to your activity time.   Continue doing the exercises given to you by the physical therapist/occupational therapist even after full range of motion has returned. The amount of time this takes will vary from person to person.   After normal range of motion has returned, some stiffness and soreness may persist for 2-3 months. This is normal and will subside.   Begin sports or strenuous activities in moderation. This will give you a chance to rebuild your endurance. Continue to be cautious of heavy lifting or carrying (no more than 10 lbs.) with your affected arm.   You may return to work as recommended by your caregiver.  NUTRITION  You may resume your normal diet.   Make sure you drink plenty of fluids (6-8 glasses a day).   Eat a well-balanced diet. Including daily  portions of food from government recommended food groups:   Grains.   Vegetables.   Fruits.   Milk.   Meat & beans.   Oils.  Visit [employer] for more information HYGIENE  You may wash your hair.   If your incision (cut from surgery) is closed, you may shower or tub bathe, unless instructed otherwise by your doctor.  FEVER  If you feel feverish or have shaking chills, take your temperature. If your temperature is 102 F (38.9 C) or above, call your caregiver. The fever may mean there is an infection.   If you call early, infection can be treated with antibiotics and hospitalization may be avoided.  PAIN CONTROL  Mild discomfort may occur.   You may need to take an over-the-counter pain medication or a medication prescribed by your caregiver.   Call your caregiver if you experience increased pain.  INCISION CARE  Check your incision daily for increased redness, drainage, swelling, or separation of skin.   Call your caregiver if any of the above are noted.  ARM AND HAND CARE  If the lymph nodes under your arm were removed with a modified radical mastectomy, there may be a greater tendency for the arm to swell.   Try to avoid having blood pressures taken, blood drawn, or injections given in the affected arm. This is the arm on the same side as the surgery.   Use hand lotion to soften cuticles instead of cutting them to avoid cutting yourself.   Be careful when shaving your under arms. Use an electric shaver if possible. You may use a deodorant after the incision has completely healed. Until then, clean under your arms with hydrogen peroxide.   Use reasonable precaution when cooking, sewing, and gardening to avoid burning or needle or thorn pricks.   Do not weigh your arm straight down with a package or your purse.   Follow the exercises and instructions given to you by the physical therapist/occupational therapist and your caregiver.  FOLLOW-UP  APPOINTMENT Call your caregiver for a follow-up appointment as directed. PROSTHESIS INFORMATION Wear your temporary prosthesis (artificial breast) until your caregiver gives you permission to purchase a permanent one. This will depend upon your rate of healing. We suggest you also wait until you are physically and emotionally ready to shop for one. The suitability depends on several individual factors. We do not endorse any particular prosthesis, but suggest you try several until you are satisfied with appearance and fit. A list of stores may be obtained from your local [employer] at www.cancer.org or 1-800-ACS-2345 (1-714 410 8669). A permanent prosthesis is medically necessary to restore balance. It is also income tax deductible.  Be sure all receipts are marked "surgical". It is not essential to purchase a bra. You may sew a pocket into your regular bra. Note: Remember to take all of your medical insurance information with you when shopping for your prosthesis. SELECTING A PROSTHESIS FITTER You may want to ask the following questions when selecting a fitter:  What styles and brands of forms are carried in stock?   How long have the forms been on the market and have there been any problems with them?   Why would one form be better than another?   How long should a particular form last?   May I wear the form for a trial period without obligation?   Do the forms require a prosthetic bra? If so, what is the price range? Must I always wear that style?   If alterations to the bra are necessary, can they be done at this location or be sent out?   Will I be charged for alterations?   Will I receive suggestions on how to alter my own wardrobe, if necessary?   Will you special order forms or bras if necessary?   Are fitters always available to meet my needs?   What kinds of garments should be worn for the fitting?   Are lounge wear, swim wear, and accessories available?   If I  have insurance coverage or Medicare, will you suggest ways for processing the paper work?   Do you keep complete records so that mail reordering is possible?   How are warranty claims handled if I have a problem with the form?  Document Released: 12/16/2003 Document Revised: 04/13/2011 Document Reviewed: 08/20/2007 [employer] Patient Information 2012 [place], [facility]  Exercises Following Breast Surgery Following all surgeries on the breast or axillary nodes, whether you had radiation treatment or not, exercises may help you return to your normal activities and way of life sooner. Before beginning any exercise, talk to your caregiver about what type of exercises will be best for you. Your caregiver may recommend getting physical therapy to help you, especially if you do not see any progress in a month of exercising. Some light exercises can be done right after the surgery, but any drains and sutures will be removed before doing the extended or heavy exercises. Generally, the exercises will lessen problems following the surgery. You can usually expect to have full range of motion of your arm back in 4 to 6 weeks.  HOME CARE INSTRUCTIONS  These exercises should be done for the first 3 to 7 days after surgery, but only with your doctor's permission.   Use your affected arm (on the side where your surgery was) as you normally would when combing your hair, bathing, dressing and eating.   Raise your affected arm above the level of your heart for 45 minutes, 2 to 3 times a day, while lying down. Put your arm on pillows so that your hand is higher than your wrist and your elbow is a little higher than your shoulder. This will help decrease the swelling that may happen after surgery.   Exercise your affected arm while it is elevated above the level of your heart by opening and closing your hand 15 to 25 times. Then, bend and straighten your elbow. Repeat this 3 to 4 times a day. This exercise helps reduce  swelling by pumping lymph fluid out of your arm.   Practice deep breathing exercises (using your diaphragm) at least 6 times each day. While lying on  your back, take a slow, deep breath. Breathe in as much air as you can while trying to expand your chest and abdomen (push your belly button away from your spine). Relax and breathe out. Repeat this 4 or 5 times. This exercise will help maintain normal movement of your chest, making it easier for your lungs to expand. Continue to do deep breathing exercises from now on.   Avoid sleeping on your affected arm or on that side.   Do not lift anything over 5 pounds.   Stop exercising if you develop pain in your chest, arm or shoulder, and call your caregiver.   Let your caregiver or therapist know if your arm becomes swollen after exercising.   Exercise in front of a mirror. This way you can see yourself exercising in a correct posture and using the correct motion that is recommended.   Do not use a heating pad on your arm of the side of the surgery.  GENERAL GUIDELINES FOR EXERCISE The exercises described here can be done as soon as your doctor gives you permission. Be sure to talk to your doctor before trying any of them.   You will feel some tightness in your chest and armpit after surgery. This is normal. The tightness will decrease as you continue your exercise program.   Many women have a burning, tingling, numbness, or soreness on the back of the arm and/or chest wall. This is because the surgery irritated some of your nerve endings. Although the sensations may increase a few weeks after surgery, continue to do the exercises unless you notice unusual swelling or tenderness. (Tell your caregiver if this occurs.) Sometimes rubbing or stroking the area with your hand or a soft cloth can help make the area less sensitive.   It may be helpful to do exercises after a warm shower when muscles are warm and relaxed.   Wear comfortable, loose clothing  when doing the exercises.   Do the exercises until you feel a slow stretch. Hold each stretch at the end of the motion for a count of five. It is normal to feel some pulling as you stretch the skin and muscles that have been shortened because of the surgery. Do not do bouncing or jerky-type movements when doing any of the exercises. You should not feel pain as you do the exercises, only gentle stretching.   Do 5 to 7 repetitions of each exercise. Try to do each exercise correctly. If you have difficulty with the exercises, contact your doctor. You may need to be referred to a physical or occupational therapist.   Exercises should be done twice a day until you regain normal flexibility and strength.   Be sure to take deep breaths, in and out, as you perform each exercise.   The exercises are designed so that you begin them lying down, move to sitting, and then finish standing.  EXERCISES IN LYING POSITION These exercises should be performed on a bed or on the floor while lying on your back. Keep your knees and hips bent and feet flat.  Wand Exercise This exercise helps increase the forward motion of the shoulders. You will need a broom handle, yardstick, or other similar object to perform this exercise.   Hold the wand in both hands with palms facing up.   Lift the wand up over your head (as far as you can) using your unaffected arm to help lift the wand, until you feel a stretch in your affected arm.  Hold for five seconds.   Lower arms and repeat 5 to 7 times.  Elbow Winging This exercise helps increase the mobility of the front of your chest and shoulder. It may take several weeks of regular exercise before your elbows will get close to the bed (or floor).   Clasp your hands behind your neck with your elbows pointing toward the ceiling.   Move your elbows apart and down toward the bed (or floor).   Repeat 5 to 7 times.  EXERCISES IN SITTING POSITION Shoulder Blade Stretch This  exercise helps increase the mobility of the shoulder blades.   Sit in a chair very close to a table.   Place the unaffected arm on the table with your elbow bent and palm down. Do not move this arm during the exercise.   Place the affected arm on the table, palm down with your elbow straight.   Without moving your trunk, slide the affected arm toward the opposite side of the table. You should feel your shoulder blade move as you do this.   Relax your arm and repeat 5 to 7 times.  Shoulder Blade Squeeze This exercise also helps increase the mobility of the shoulder blade.   Facing straight ahead, sit in a chair in front of a mirror without resting on the back of the chair.   Arms should be at your sides with elbows bent.   Squeeze shoulder blades together, bringing your elbows behind you. Keep your shoulders level as you do this exercise. Do not lift your shoulders up toward your ears.   Return to the starting position and repeat 5 to 7 times.  Side Bending This exercise helps increase the mobility of the trunk/body.   Clasp your hands together in front of you and lift your arms slowly over your head, straightening your arms.   When your arms are over your head, bend your trunk to the right while bending at the waist and keeping your arms overhead.   Return to the starting position and bend to the left.   Repeat 5 to 7 times.  EXERCISES IN STANDING POSITION Chest Wall Stretch This exercise helps stretch the chest wall.   Stand facing a corner with toes approximately 8 to 10 inches from the corner.   Bend your elbows and place forearms on the wall, one on each side of the corner. Your elbows should be as close to shoulder height as possible.   Keep your arms and feet in position and move your chest toward the corner. You will feel a stretch across your chest and shoulders.   Return to starting position and repeat 5 to 7 times.  Shoulder Stretch This exercise helps increase  the mobility in the shoulder.   Stand facing the wall with your toes approximately 8 to 10 inches from the wall.   Place your hands on the wall. Use your fingers to "climb the wall," reaching as high as you can until you feel a stretch.   Return to starting position and repeat 5 to 7 times.  THINGS TO KEEP IN MIND  Begin exercising slowly and keep going as you are able. Stop exercising and call your caregiver if you:  Get weaker, start losing your balance or start falling.   Have pain that gets worse.   Have new heaviness in your arm.   Have unusual swelling, or swelling gets worse.   Have headaches, dizziness, blurred vision, new numbness or tingling in arms or chest.  It is important to exercise to keep muscles working as well as possible, but it is also important to be safe. Talk with your caregiver about realistic exercises for your condition. Then set goals for increasing your physical activity level.  Document Released: 11/14/2005 Document Revised: 04/13/2011 Document Reviewed: 06/08/2008 [facility] Patient Information 2012 [place], [place].
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
## 2011-08-25 ENCOUNTER — Encounter (HOSPITAL_COMMUNITY): Payer: Self-pay | Admitting: Pharmacy Technician

## 2011-08-25 ENCOUNTER — Encounter (INDEPENDENT_AMBULATORY_CARE_PROVIDER_SITE_OTHER): Payer: Medicare HMO | Admitting: Surgery

## 2011-08-31 NOTE — Pre-Procedure Instructions (Signed)
20 Wanda Murillo  08/31/2011   Your procedure is scheduled on:  Tuesday, May 7th.  Report to [facility] at 5:30 AM.  Call this number if you have problems the morning of surgery: [phone]   Remember:   Do not eat food:After Midnight.  May have clear liquids: up to 4 Hours before arrival. (1:30)  Clear liquids include soda, tea, black coffee, apple or grape juice, broth.   Take these medicines the morning of surgery with A SIP OF WATER:  Omeprazole (Protonix). Use Pro-Air Inhaler and bring to the hospital with you.  Do not wear jewelry, make-up or nail polish.  Do not wear lotions, powders, or perfumes. You may wear deodorant.  Do not shave 48 hours prior to surgery.  Do not bring valuables to the hospital.  Contacts, dentures or bridgework may not be worn into surgery.  Leave suitcase in the car. After surgery it may be brought to your room.  For patients admitted to the hospital, checkout time is 11:00 AM the day of discharge.   Patients discharged the day of surgery will not be allowed to drive home.  Name and phone number of your driver: --  Special Instructions: CHG Shower Use Special Wash: 1/2 bottle night before surgery and 1/2 bottle morning of surgery.   Please read over the following fact sheets that you were given: Pain Booklet, Coughing and Deep Breathing, Total Joint Packet and MRSA Information

## 2011-09-01 ENCOUNTER — Encounter (HOSPITAL_COMMUNITY)
Admission: RE | Admit: 2011-09-01 | Discharge: 2011-09-01 | Disposition: A | Discharge status: Home / Self Care | Payer: Medicare HMO | Source: Ambulatory Visit | Attending: Surgery | Admitting: Surgery

## 2011-09-01 ENCOUNTER — Encounter (HOSPITAL_COMMUNITY): Payer: Self-pay

## 2011-09-01 VITALS — BP 124/68 | HR 58 | Temp 98.6°F | Resp 20 | Ht 59.0 in | Wt 131.3 lb

## 2011-09-01 DIAGNOSIS — D0510 Intraductal carcinoma in situ of unspecified breast: Secondary | ICD-10-CM

## 2011-09-01 DIAGNOSIS — C50919 Malignant neoplasm of unspecified site of unspecified female breast: Secondary | ICD-10-CM

## 2011-09-01 HISTORY — DX: Malignant neoplasm of unspecified site of unspecified female breast: C50.919

## 2011-09-01 HISTORY — DX: Gastro-esophageal reflux disease without esophagitis: K21.9

## 2011-09-01 HISTORY — DX: Personal history of urinary (tract) infections: Z87.440

## 2011-09-01 HISTORY — DX: Coronary angioplasty status: Z98.61

## 2011-09-01 HISTORY — DX: Malignant (primary) neoplasm, unspecified: C80.1

## 2011-09-01 HISTORY — DX: Shortness of breath: R06.02

## 2011-09-01 HISTORY — DX: Personal history of peptic ulcer disease: Z87.11

## 2011-09-01 HISTORY — DX: Acute myocardial infarction, unspecified: I21.9

## 2011-09-01 LAB — CBC
Hemoglobin: 13.2 g/dL (ref 12.0–15.0)
MCHC: 33 g/dL (ref 30.0–36.0)
Platelets: 259 10*3/uL (ref 150–400)
RBC: 4.48 MIL/uL (ref 3.87–5.11)

## 2011-09-01 LAB — COMPREHENSIVE METABOLIC PANEL
ALT: 20 U/L (ref 0–35)
AST: 24 U/L (ref 0–37)
Alkaline Phosphatase: 87 U/L (ref 39–117)
GFR calc Af Amer: >90 mL/min (ref >90)
Glucose, Bld: 91 mg/dL (ref 70–99)
Potassium: 3.7 mEq/L (ref 3.5–5.1)
Sodium: 140 mEq/L (ref 135–145)
Total Protein: 7.2 g/dL (ref 6.0–8.3)

## 2011-09-01 LAB — DIFFERENTIAL
Eosinophils Absolute: 0.1 10*3/uL (ref 0.0–0.7)
Eosinophils Relative: 2 % (ref 0–5)
Lymphs Abs: 1.6 10*3/uL (ref 0.7–4.0)
Monocytes Absolute: 0.3 10*3/uL (ref 0.1–1.0)
Monocytes Relative: 5 % (ref 3–12)

## 2011-09-01 LAB — SURGICAL PCR SCREEN: MRSA, PCR: NEGATIVE

## 2011-09-07 ENCOUNTER — Telehealth: Payer: Self-pay | Admitting: Cardiology

## 2011-09-07 NOTE — Telephone Encounter (Signed)
I spoke with daughter and her mother's surgery is scheduled for 09/12/11 with Dr. Merlinda Frederick  At [facility] Does she need to come in for cardiac clearance? I will forward to Dr. Malachy Moan RN

## 2011-09-07 NOTE — Telephone Encounter (Signed)
Please return call to patient daughter Wanda Murillo  Does patient need cardiac clearance for double masectomy scheduled 09/12/11.  Please return call to patient at [phone].

## 2011-09-11 MED ORDER — CHLORHEXIDINE GLUCONATE 4 % EX LIQD: 1.0000 "application " | Freq: Once | CUTANEOUS | Status: DC

## 2011-09-11 MED ORDER — CEFAZOLIN SODIUM-DEXTROSE 2-3 GM-% IV SOLR
2.0000 g | INTRAVENOUS | Status: AC
  Administered 2011-09-12: 2 g via INTRAVENOUS
  Filled 2011-09-11: qty 50

## 2011-09-11 NOTE — Telephone Encounter (Signed)
She does not need any further cardiac workup. She is cleared for her surgery.

## 2011-09-11 NOTE — Telephone Encounter (Signed)
Pt was notified.  

## 2011-09-12 ENCOUNTER — Encounter (HOSPITAL_COMMUNITY): Payer: Self-pay | Admitting: Anesthesiology

## 2011-09-12 ENCOUNTER — Encounter (HOSPITAL_COMMUNITY)
Admission: RE | Disposition: A | Discharge status: Home / Self Care | Payer: Self-pay | Source: Ambulatory Visit | Attending: Surgery

## 2011-09-12 ENCOUNTER — Ambulatory Visit (HOSPITAL_COMMUNITY): Payer: Medicare HMO | Admitting: Anesthesiology

## 2011-09-12 ENCOUNTER — Ambulatory Visit (HOSPITAL_COMMUNITY)
Admission: RE | Admit: 2011-09-12 | Discharge: 2011-09-12 | Disposition: A | Discharge status: Home / Self Care | Payer: Medicare HMO | Source: Ambulatory Visit | Attending: Surgery | Admitting: Surgery

## 2011-09-12 ENCOUNTER — Ambulatory Visit (HOSPITAL_COMMUNITY)
Admission: RE | Admit: 2011-09-12 | Discharge: 2011-09-13 | Disposition: A | Discharge status: Home / Self Care | Payer: Medicare HMO | Source: Ambulatory Visit | Attending: Surgery | Admitting: Surgery

## 2011-09-12 DIAGNOSIS — I2510 Atherosclerotic heart disease of native coronary artery without angina pectoris: Secondary | ICD-10-CM | POA: Insufficient documentation

## 2011-09-12 DIAGNOSIS — E785 Hyperlipidemia, unspecified: Secondary | ICD-10-CM

## 2011-09-12 DIAGNOSIS — D0510 Intraductal carcinoma in situ of unspecified breast: Secondary | ICD-10-CM

## 2011-09-12 DIAGNOSIS — R9430 Abnormal result of cardiovascular function study, unspecified: Secondary | ICD-10-CM

## 2011-09-12 DIAGNOSIS — M1990 Unspecified osteoarthritis, unspecified site: Secondary | ICD-10-CM | POA: Insufficient documentation

## 2011-09-12 DIAGNOSIS — D0590 Unspecified type of carcinoma in situ of unspecified breast: Secondary | ICD-10-CM | POA: Insufficient documentation

## 2011-09-12 DIAGNOSIS — C50919 Malignant neoplasm of unspecified site of unspecified female breast: Secondary | ICD-10-CM | POA: Insufficient documentation

## 2011-09-12 DIAGNOSIS — K219 Gastro-esophageal reflux disease without esophagitis: Secondary | ICD-10-CM | POA: Insufficient documentation

## 2011-09-12 DIAGNOSIS — R001 Bradycardia, unspecified: Secondary | ICD-10-CM

## 2011-09-12 DIAGNOSIS — I252 Old myocardial infarction: Secondary | ICD-10-CM | POA: Insufficient documentation

## 2011-09-12 DIAGNOSIS — J45909 Unspecified asthma, uncomplicated: Secondary | ICD-10-CM | POA: Insufficient documentation

## 2011-09-12 DIAGNOSIS — Z8711 Personal history of peptic ulcer disease: Secondary | ICD-10-CM | POA: Insufficient documentation

## 2011-09-12 DIAGNOSIS — I10 Essential (primary) hypertension: Secondary | ICD-10-CM | POA: Insufficient documentation

## 2011-09-12 HISTORY — PX: MASTECTOMY: SHX3

## 2011-09-12 HISTORY — PX: MASTECTOMY W/ SENTINEL NODE BIOPSY: SHX2001

## 2011-09-12 SURGERY — MASTECTOMY WITH SENTINEL LYMPH NODE BIOPSY
Anesthesia: General | Site: Breast | Laterality: Bilateral | Wound class: Clean

## 2011-09-12 MED ORDER — SODIUM CHLORIDE 0.9 % IR SOLN
Status: DC | PRN
  Administered 2011-09-12 (×3): 1000 mL

## 2011-09-12 MED ORDER — ONDANSETRON HCL 4 MG/2ML IJ SOLN
INTRAMUSCULAR | Status: DC | PRN
  Administered 2011-09-12: 4 mg via INTRAVENOUS

## 2011-09-12 MED ORDER — HYDROMORPHONE HCL PF 1 MG/ML IJ SOLN
0.2500 mg | INTRAMUSCULAR | Status: DC | PRN
  Administered 2011-09-12 (×2): 0.5 mg via INTRAVENOUS

## 2011-09-12 MED ORDER — MIDAZOLAM HCL 5 MG/5ML IJ SOLN
INTRAMUSCULAR | Status: DC | PRN
  Administered 2011-09-12 (×2): 1 mg via INTRAVENOUS

## 2011-09-12 MED ORDER — ENOXAPARIN SODIUM 40 MG/0.4ML ~~LOC~~ SOLN
40.0000 mg | SUBCUTANEOUS | Status: DC
  Filled 2011-09-12 (×2): qty 0.4

## 2011-09-12 MED ORDER — LISINOPRIL 10 MG PO TABS
10.0000 mg | ORAL_TABLET | Freq: Every day | ORAL | Status: DC
  Administered 2011-09-12: 10 mg via ORAL
  Filled 2011-09-12 (×2): qty 1

## 2011-09-12 MED ORDER — DEXAMETHASONE SODIUM PHOSPHATE 4 MG/ML IJ SOLN
INTRAMUSCULAR | Status: DC | PRN
  Administered 2011-09-12: 4 mg via INTRAVENOUS

## 2011-09-12 MED ORDER — CHLORHEXIDINE GLUCONATE 0.12 % MT SOLN
15.0000 mL | Freq: Two times a day (BID) | OROMUCOSAL | Status: DC
  Administered 2011-09-12 (×2): 15 mL via OROMUCOSAL
  Filled 2011-09-12 (×2): qty 15

## 2011-09-12 MED ORDER — SUCCINYLCHOLINE CHLORIDE 20 MG/ML IJ SOLN
INTRAMUSCULAR | Status: DC | PRN
  Administered 2011-09-12: 60 mg via INTRAVENOUS

## 2011-09-12 MED ORDER — ONDANSETRON HCL 4 MG/2ML IJ SOLN: 4.0000 mg | Freq: Four times a day (QID) | INTRAMUSCULAR | Status: DC | PRN

## 2011-09-12 MED ORDER — ALBUTEROL SULFATE HFA 108 (90 BASE) MCG/ACT IN AERS
2.0000 | INHALATION_SPRAY | RESPIRATORY_TRACT | Status: DC | PRN
  Filled 2011-09-12 (×2): qty 6.7

## 2011-09-12 MED ORDER — EPHEDRINE SULFATE 50 MG/ML IJ SOLN
INTRAMUSCULAR | Status: DC | PRN
  Administered 2011-09-12: 5 mg via INTRAVENOUS

## 2011-09-12 MED ORDER — ONDANSETRON HCL 4 MG/2ML IJ SOLN: 4.0000 mg | Freq: Once | INTRAMUSCULAR | Status: DC | PRN

## 2011-09-12 MED ORDER — SIMVASTATIN 10 MG PO TABS
10.0000 mg | ORAL_TABLET | Freq: Every day | ORAL | Status: DC
  Administered 2011-09-12: 10 mg via ORAL
  Filled 2011-09-12 (×2): qty 1

## 2011-09-12 MED ORDER — PROPOFOL 10 MG/ML IV EMUL
INTRAVENOUS | Status: DC | PRN
  Administered 2011-09-12: 150 mg via INTRAVENOUS

## 2011-09-12 MED ORDER — KCL IN DEXTROSE-NACL 20-5-0.45 MEQ/L-%-% IV SOLN
INTRAVENOUS | Status: DC
  Administered 2011-09-12 (×2): via INTRAVENOUS
  Filled 2011-09-12 (×4): qty 1000

## 2011-09-12 MED ORDER — PANTOPRAZOLE SODIUM 40 MG PO TBEC
40.0000 mg | DELAYED_RELEASE_TABLET | Freq: Every day | ORAL | Status: DC
  Administered 2011-09-12: 40 mg via ORAL
  Filled 2011-09-12: qty 1

## 2011-09-12 MED ORDER — LIDOCAINE HCL 4 % MT SOLN
OROMUCOSAL | Status: DC | PRN
  Administered 2011-09-12: 4 mL via TOPICAL

## 2011-09-12 MED ORDER — GLYCOPYRROLATE 0.2 MG/ML IJ SOLN
INTRAMUSCULAR | Status: DC | PRN
  Administered 2011-09-12: 0.2 mg via INTRAVENOUS

## 2011-09-12 MED ORDER — SODIUM CHLORIDE 0.9 % IJ SOLN
INTRAMUSCULAR | Status: DC | PRN
  Administered 2011-09-12: 10:00:00 via INTRAMUSCULAR

## 2011-09-12 MED ORDER — LIDOCAINE HCL (CARDIAC) 20 MG/ML IV SOLN
INTRAVENOUS | Status: DC | PRN
  Administered 2011-09-12: 30 mg via INTRAVENOUS

## 2011-09-12 MED ORDER — HEMOSTATIC AGENTS (NO CHARGE) OPTIME
TOPICAL | Status: DC | PRN
  Administered 2011-09-12 (×2): 1 via TOPICAL

## 2011-09-12 MED ORDER — MORPHINE SULFATE 2 MG/ML IJ SOLN
2.0000 mg | INTRAMUSCULAR | Status: DC | PRN
  Administered 2011-09-12: 2 mg via INTRAVENOUS
  Filled 2011-09-12: qty 1

## 2011-09-12 MED ORDER — BIOTENE DRY MOUTH MT LIQD
15.0000 mL | Freq: Two times a day (BID) | OROMUCOSAL | Status: DC
  Administered 2011-09-12: 15 mL via OROMUCOSAL

## 2011-09-12 MED ORDER — CEFAZOLIN SODIUM 1-5 GM-% IV SOLN
1.0000 g | Freq: Three times a day (TID) | INTRAVENOUS | Status: AC
  Administered 2011-09-12: 1 g via INTRAVENOUS
  Filled 2011-09-12: qty 50

## 2011-09-12 MED ORDER — ONDANSETRON HCL 4 MG PO TABS: 4.0000 mg | ORAL_TABLET | Freq: Four times a day (QID) | ORAL | Status: DC | PRN

## 2011-09-12 MED ORDER — FENTANYL CITRATE 0.05 MG/ML IJ SOLN
INTRAMUSCULAR | Status: DC | PRN
  Administered 2011-09-12 (×5): 50 ug via INTRAVENOUS

## 2011-09-12 MED ORDER — OXYCODONE-ACETAMINOPHEN 5-325 MG PO TABS
1.0000 | ORAL_TABLET | ORAL | Status: DC | PRN
Start: 2011-09-12 — End: 2011-09-13
  Administered 2011-09-12 – 2011-09-13 (×4): 2 via ORAL
  Filled 2011-09-12 (×4): qty 2

## 2011-09-12 MED ORDER — TECHNETIUM TC 99M SULFUR COLLOID FILTERED
2.0000 | Freq: Once | INTRAVENOUS | Status: AC | PRN
  Administered 2011-09-12: 2 via INTRADERMAL

## 2011-09-12 MED ORDER — LACTATED RINGERS IV SOLN
INTRAVENOUS | Status: DC | PRN
  Administered 2011-09-12 (×2): via INTRAVENOUS

## 2011-09-12 MED ORDER — ANASTROZOLE 1 MG PO TABS
1.0000 mg | ORAL_TABLET | Freq: Every day | ORAL | Status: DC
  Administered 2011-09-12: 1 mg via ORAL
  Filled 2011-09-12 (×2): qty 1

## 2011-09-12 SURGICAL SUPPLY — 55 items
ADH SKN CLS APL DERMABOND .7 (GAUZE/BANDAGES/DRESSINGS) ×4
APPLIER CLIP 9.375 MED OPEN (MISCELLANEOUS) ×4
APR CLP MED 9.3 20 MLT OPN (MISCELLANEOUS) ×2
BINDER BREAST LRG (GAUZE/BANDAGES/DRESSINGS) ×1 IMPLANT
BINDER BREAST XLRG (GAUZE/BANDAGES/DRESSINGS) IMPLANT
CANISTER SUCTION 2500CC (MISCELLANEOUS) ×2 IMPLANT
CHLORAPREP W/TINT 26ML (MISCELLANEOUS) ×2 IMPLANT
CLIP APPLIE 9.375 MED OPEN (MISCELLANEOUS) ×2 IMPLANT
CLOTH BEACON ORANGE TIMEOUT ST (SAFETY) ×2 IMPLANT
CONT SPEC 4OZ CLIKSEAL STRL BL (MISCELLANEOUS) ×7 IMPLANT
COVER PROBE W GEL 5X96 (DRAPES) ×2 IMPLANT
COVER SURGICAL LIGHT HANDLE (MISCELLANEOUS) ×2 IMPLANT
DERMABOND ADVANCED (GAUZE/BANDAGES/DRESSINGS) ×4
DERMABOND ADVANCED .7 DNX12 (GAUZE/BANDAGES/DRESSINGS) ×1 IMPLANT
DRAIN CHANNEL 19F RND (DRAIN) ×3 IMPLANT
DRAPE LAPAROSCOPIC ABDOMINAL (DRAPES) ×2 IMPLANT
DRAPE PROXIMA HALF (DRAPES) ×2 IMPLANT
DRAPE UTILITY 15X26 W/TAPE STR (DRAPE) ×4 IMPLANT
ELECT CAUTERY BLADE 6.4 (BLADE) ×2 IMPLANT
ELECT REM PT RETURN 9FT ADLT (ELECTROSURGICAL) ×2
ELECTRODE REM PT RTRN 9FT ADLT (ELECTROSURGICAL) ×1 IMPLANT
EVACUATOR SILICONE 100CC (DRAIN) ×3 IMPLANT
GLOVE BIO SURGEON STRL SZ7 (GLOVE) ×1 IMPLANT
GLOVE BIO SURGEON STRL SZ8 (GLOVE) ×2 IMPLANT
GLOVE BIOGEL PI IND STRL 6.5 (GLOVE) IMPLANT
GLOVE BIOGEL PI IND STRL 7.0 (GLOVE) IMPLANT
GLOVE BIOGEL PI IND STRL 7.5 (GLOVE) ×1 IMPLANT
GLOVE BIOGEL PI IND STRL 8 (GLOVE) ×1 IMPLANT
GLOVE BIOGEL PI INDICATOR 6.5 (GLOVE) ×1
GLOVE BIOGEL PI INDICATOR 7.0 (GLOVE) ×3
GLOVE BIOGEL PI INDICATOR 7.5 (GLOVE) ×1
GLOVE BIOGEL PI INDICATOR 8 (GLOVE) ×1
GLOVE ECLIPSE 6.5 STRL STRAW (GLOVE) ×2 IMPLANT
GLOVE SURG SS PI 6.5 STRL IVOR (GLOVE) ×2 IMPLANT
GOWN STRL NON-REIN LRG LVL3 (GOWN DISPOSABLE) ×12 IMPLANT
HEMOSTAT SNOW SURGICEL 2X4 (HEMOSTASIS) ×2 IMPLANT
KIT BASIN OR (CUSTOM PROCEDURE TRAY) ×2 IMPLANT
KIT ROOM TURNOVER OR (KITS) ×2 IMPLANT
NDL 18GX1X1/2 (RX/OR ONLY) (NEEDLE) ×1 IMPLANT
NEEDLE 18GX1X1/2 (RX/OR ONLY) (NEEDLE) ×2 IMPLANT
NEEDLE HYPO 25GX1X1/2 BEV (NEEDLE) ×2 IMPLANT
NS IRRIG 1000ML POUR BTL (IV SOLUTION) ×4 IMPLANT
PACK GENERAL/GYN (CUSTOM PROCEDURE TRAY) ×2 IMPLANT
PAD ARMBOARD 7.5X6 YLW CONV (MISCELLANEOUS) ×2 IMPLANT
SPECIMEN JAR X LARGE (MISCELLANEOUS) ×1 IMPLANT
SPONGE GAUZE 4X4 12PLY (GAUZE/BANDAGES/DRESSINGS) ×1 IMPLANT
SPONGE LAP 18X18 X RAY DECT (DISPOSABLE) ×3 IMPLANT
SUT ETHILON 3 0 FSL (SUTURE) ×4 IMPLANT
SUT MNCRL AB 4-0 PS2 18 (SUTURE) ×3 IMPLANT
SUT VIC AB 3-0 SH 18 (SUTURE) ×3 IMPLANT
SYR CONTROL 10ML LL (SYRINGE) ×2 IMPLANT
TAPE CLOTH SURG 4X10 WHT LF (GAUZE/BANDAGES/DRESSINGS) ×1 IMPLANT
TOWEL OR 17X24 6PK STRL BLUE (TOWEL DISPOSABLE) ×1 IMPLANT
TOWEL OR 17X26 10 PK STRL BLUE (TOWEL DISPOSABLE) ×2 IMPLANT
TOWEL OR NON WOVEN STRL DISP B (DISPOSABLE) ×1 IMPLANT

## 2011-09-12 NOTE — Anesthesia Postprocedure Evaluation (Signed)
  Anesthesia Post-op Note  Patient: Wanda Murillo  Procedure(s) Performed: Procedure(s) (LRB): MASTECTOMY WITH SENTINEL LYMPH NODE BIOPSY (Bilateral)  Patient Location: PACU  Anesthesia Type: General  Level of Consciousness: awake, alert , oriented and patient cooperative  Airway and Oxygen Therapy: Patient Spontanous Breathing and Patient connected to nasal cannula oxygen  Post-op Pain: mild  Post-op Assessment: Post-op Vital signs reviewed, Patient's Cardiovascular Status Stable, Respiratory Function Stable, Patent Airway, No signs of Nausea or vomiting and Pain level controlled  Post-op Vital Signs: stable  Complications: No apparent anesthesia complications

## 2011-09-12 NOTE — Transfer of Care (Signed)
Immediate Anesthesia Transfer of Care Note  Patient: Wanda Murillo  Procedure(s) Performed: Procedure(s) (LRB): MASTECTOMY WITH SENTINEL LYMPH NODE BIOPSY (Bilateral)  Patient Location: PACU  Anesthesia Type: General  Level of Consciousness: sedated  Airway & Oxygen Therapy: Patient Spontanous Breathing and Patient connected to face mask oxygen  Post-op Assessment: Report given to PACU RN and Post -op Vital signs reviewed and stable  Post vital signs: Reviewed and stable  Complications: No apparent anesthesia complications

## 2011-09-12 NOTE — OR Nursing (Signed)
Spoke in person w/ dr Luisa Hart re" PIV l hand and is ok for that to remain there
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
## 2011-09-12 NOTE — Anesthesia Preprocedure Evaluation (Addendum)
Anesthesia Evaluation  Patient identified by MRN, date of birth, ID band Patient awake    Reviewed: Allergy & Precautions, H&P , NPO status , Patient's Chart, lab work & pertinent test results  Airway Mallampati: I TM Distance: >3 FB Neck ROM: full    Dental   Pulmonary shortness of breath, asthma ,          Cardiovascular hypertension, + CAD and + Past MI Rhythm:regular Rate:Normal     Neuro/Psych    GI/Hepatic PUD, GERD-  ,  Endo/Other    Renal/GU      Musculoskeletal   Abdominal   Peds  Hematology   Anesthesia Other Findings   Reproductive/Obstetrics                           Anesthesia Physical Anesthesia Plan  ASA: III  Anesthesia Plan: General   Post-op Pain Management:    Induction: Intravenous  Airway Management Planned: LMA and Oral ETT  Additional Equipment:   Intra-op Plan:   Post-operative Plan: Extubation in OR  Informed Consent: I have reviewed the patients History and Physical, chart, labs and discussed the procedure including the risks, benefits and alternatives for the proposed anesthesia with the patient or authorized representative who has indicated his/her understanding and acceptance.     Plan Discussed with: CRNA, Anesthesiologist and Surgeon  Anesthesia Plan Comments:         Anesthesia Quick Evaluation

## 2011-09-12 NOTE — Progress Notes (Signed)
Received from PACU post bilateral mastectomy, arousable, VSS,incision intact with JP drainage. Will continue to monitor.

## 2011-09-12 NOTE — Op Note (Signed)
Preoperative diagnosis: #1 left breast cancer stage II and right breast DCIS  Postoperative diagnosis same  Procedure: Bilateral simple mastectomy and bilateral sentinel lymph node mapping with injection of methylene blue dye  Surgeon: Harriette Bouillon M.D.  Assistant: Emelia Loron M.D.  Anesthesia: Gen. endotracheal anesthesia  EBL: 50 cc  Specimen: Bilateral breasts bilateral sentinel lymph nodes to pathology  Drains: 19 round drain to the wound x2  IV fluids: 1500 cc crystalloid  Indications for procedure: The patient presents to to a stage II left breast cancer and DCIS on the right. She was to undergo bilateral mastectomy and SLN mapping on both sides.The surgical and non surgical options have been discussed with the patient.  Risks of surgery include bleeding,  Infection,  Flap necrosis,  Tissue loss,  Chronic pain, death, Numbness,  And the need for additional procedures.  Reconstruction options also have been discussed with the patient as well.  The patient agrees to proceed.  Description of procedure: The patient was seen in the holding area and questions are answered. The patient underwent bilateral injection for sentinel lymph node mapping by nuclear medicine. He was taken back to the operating room. After induction of general endotracheal anesthesia, 4 cc of methylene blue mixed with saline were injected under the nipple bilaterally under sterile conditions. Timeout was done. Antibiotics were given. Upper chest was prepped and draped in a sterile fashion bilaterally. Right side was done first. Curvilinear incisions were made above and below the nipple. Superior and inferior skin flaps were raised the clavicle and inferior mammary fold. Neoprobe was used for identification hot sentinel nodes in right axilla. 3 were removed. These were hot and blue. The breast was excised in a medial to lateral fashion off the chest wall. Hemostasis achieved.  In a similar fashion, the left  breast was removed. Curvilinear incisions were made above and below the nipple. Superior and inferior skin flaps were raised the clavicle and inferior mammary fold. Neoprobe was used identify 3 hot and blue left axillary lymph nodes. These are sent to pathology. The breast was removed in the medial to lateral fashion. The tumor was in the left lower outer and  easily seen. Grossly no tumor was exposed. The breast was passed off the field after being oriented. Hemostasis was achieved. Irrigation was used. Surgicel was placed in both axilla. 19 round drain were placed on each side and secured to the skin with 2-0 nylon both wounds were closed with 3-0 Vicryl and 4-0 Monocryl. Dermabond was applied to each. drains were placed to suction. All final counts sponge, needles and instruments are found to be correct. The patient was awoke taken to recovery in satisfactory condition.

## 2011-09-12 NOTE — Progress Notes (Signed)
MD called and verified if we could stick patients arm for blood draws and Md stated we can stick either arms.

## 2011-09-12 NOTE — Interval H&P Note (Signed)
History and Physical Interval Note:  09/12/2011 7:35 AM  Wanda Murillo  has presented today for surgery, with the diagnosis of breast cancer  The various methods of treatment have been discussed with the patient and family. After consideration of risks, benefits and other options for treatment, the patient has consented to  Procedure(s) (LRB): MASTECTOMY WITH SENTINEL LYMPH NODE BIOPSY (Bilateral) as a surgical intervention .  The patients' history has been reviewed, patient examined, no change in status, stable for surgery.  I have reviewed the patients' chart and labs.  Questions were answered to the patient's satisfaction.     Wanda Steyer A.

## 2011-09-12 NOTE — Progress Notes (Signed)
nuc med called --advised of pts arrival.

## 2011-09-13 LAB — CBC
HCT: 30.1 % — ABNORMAL LOW (ref 36.0–46.0)
Hemoglobin: 9.5 g/dL — ABNORMAL LOW (ref 12.0–15.0)
RDW: 14 % (ref 11.5–15.5)
WBC: 5.5 10*3/uL (ref 4.0–10.5)

## 2011-09-13 LAB — BASIC METABOLIC PANEL
Chloride: 105 mEq/L (ref 96–112)
GFR calc Af Amer: >90 mL/min (ref >90)
GFR calc non Af Amer: 82 mL/min — ABNORMAL LOW (ref >90)
Potassium: 3.9 mEq/L (ref 3.5–5.1)

## 2011-09-13 MED ORDER — OXYCODONE-ACETAMINOPHEN 5-325 MG PO TABS: 1.0000 | ORAL_TABLET | ORAL | Status: DC | PRN

## 2011-09-13 NOTE — Progress Notes (Signed)
DC home with daughter. DC instructions given to pt.. Daughter to empty drains verbally understands. Supplies sent with pt.Marland Kitchen

## 2011-09-13 NOTE — Progress Notes (Signed)
1 Day Post-Op  Subjective: Looks well  Objective: Vital signs in last 24 hours: Temp:  [97.1 F (36.2 C)-99.6 F (37.6 C)] 98.6 F (37 C) (05/08 0521) Pulse Rate:  [57-72] 62  (05/08 0521) Resp:  [12-24] 18  (05/08 0521) BP: (122-174)/(51-90) 122/51 mmHg (05/08 0521) SpO2:  [96 %-100 %] 96 % (05/08 0521) Last BM Date: 09/11/11  Intake/Output from previous day: 05/07 0701 - 05/08 0700 In: 3929.8 [P.O.:360; I.V.:3569.8] Out: 3273 [Urine:2801; Drains:372; Blood:100] Intake/Output this shift:    Incision/Wound:wound clean dry intact flaps viable  JP minimal  Lab Results:   Basename 09/13/11 0650  WBC 5.5  HGB 9.5*  HCT 30.1*  PLT 173   BMET  Basename 09/13/11 0650  NA 138  K 3.9  CL 105  CO2 27  GLUCOSE 107*  BUN 12  CREATININE 0.73  CALCIUM 8.3*   PT/INR No results found for this basename: LABPROT:2,INR:2 in the last 72 hours ABG No results found for this basename: PHART:2,PCO2:2,PO2:2,HCO3:2 in the last 72 hours  Studies/Results: Nm Sentinel Node Inj-no Rpt (breast)  09/12/2011  CLINICAL DATA: BREAST CANCER BILATERAL   Sulfur colloid was injected intradermally by the nuclear medicine  technologist for breast cancer sentinel node localization.      Anti-infectives: Anti-infectives     Start     Dose/Rate Route Frequency Ordered Stop   09/12/11 1600   ceFAZolin (ANCEF) IVPB 1 g/50 mL premix        1 g 100 mL/hr over 30 Minutes Intravenous 3 times per day 09/12/11 1130 09/12/11 1605   09/11/11 1450   ceFAZolin (ANCEF) IVPB 2 g/50 mL premix        2 g 100 mL/hr over 30 Minutes Intravenous 60 min pre-op 09/11/11 1450 09/12/11 0801          Assessment/Plan: s/p Procedure(s) (LRB): MASTECTOMY WITH SENTINEL LYMPH NODE BIOPSY (Bilateral) Discharge  LOS: 1 day    Wanda Bunn A. 09/13/2011

## 2011-09-13 NOTE — Discharge Instructions (Signed)
[facility] °[phone] ° °MASTECTOMY: POST OP INSTRUCTIONS ° °Always review your discharge instruction sheet given to you by the facility where your surgery was performed. °IF YOU HAVE DISABILITY OR FAMILY LEAVE FORMS, YOU MUST BRING THEM TO THE OFFICE FOR PROCESSING.   °DO NOT GIVE THEM TO YOUR DOCTOR. °A prescription for pain medication may be given to you upon discharge.  Take your pain medication as prescribed, if needed.  If narcotic pain medicine is not needed, then you may take acetaminophen (Tylenol) or ibuprofen (Advil) as needed. °1. Take your usually prescribed medications unless otherwise directed. °2. If you need a refill on your pain medication, please contact your pharmacy.  They will contact our office to request authorization.  Prescriptions will not be filled after 5pm or on week-ends. °3. You should follow a light diet the first few days after arrival home, such as soup and crackers, etc.  Resume your normal diet the day after surgery. °4. Most patients will experience some swelling and bruising on the chest and underarm.  Ice packs will help.  Swelling and bruising can take several days to resolve.  °5. It is common to experience some constipation if taking pain medication after surgery.  Increasing fluid intake and taking a stool softener (such as Colace) will usually help or prevent this problem from occurring.  A mild laxative (Milk of Magnesia or Miralax) should be taken according to package instructions if there are no bowel movements after 48 hours. °6. Unless discharge instructions indicate otherwise, leave your bandage dry and in place until your next appointment in 3-5 days.  You may take a limited sponge bath.  No tube baths or showers until the drains are removed.  You may have steri-strips (small skin tapes) in place directly over the incision.  These strips should be left on the skin for 7-10 days.  If your surgeon used skin glue on the incision, you may  shower in 24 hours.  The glue will flake off over the next 2-3 weeks.  Any sutures or staples will be removed at the office during your follow-up visit. °7. DRAINS:  If you have drains in place, it is important to keep a list of the amount of drainage produced each day in your drains.  Before leaving the hospital, you should be instructed on drain care.  Call our office if you have any questions about your drains. °8. ACTIVITIES:  You may resume regular (light) daily activities beginning the next day--such as daily self-care, walking, climbing stairs--gradually increasing activities as tolerated.  You may have sexual intercourse when it is comfortable.  Refrain from any heavy lifting or straining until approved by your doctor. °a. You may drive when you are no longer taking prescription pain medication, you can comfortably wear a seatbelt, and you can safely maneuver your car and apply brakes. °b. RETURN TO WORK:  __________________________________________________________ °9. You should see your doctor in the office for a follow-up appointment approximately 3-5 days after your surgery.  Your doctor’s nurse will typically make your follow-up appointment when she calls you with your pathology report.  Expect your pathology report 2-3 business days after your surgery.  You may call to check if you do not hear from us after three days.   °10. OTHER INSTRUCTIONS: ______________________________________________________________________________________________ ____________________________________________________________________________________________ °WHEN TO CALL YOUR DOCTOR: °1. Fever over 101.0 °2. Nausea and/or vomiting °3. Extreme swelling or bruising °4. Continued bleeding from incision. °5. Increased pain, redness, or drainage from the incision. °  The clinic staff is available to answer your questions during regular business hours.  Please don’t hesitate to call and ask to speak to one of the nurses for clinical  concerns.  If you have a medical emergency, go to the nearest emergency room or call 911.  A surgeon from [facility] is always on call at the hospital. °[address], [address], [place], [place]  [address] ? [address] [address], [place], [place]   [address] °[phone] ? [phone] ? FAX [phone] °Web site: [email or web address] °

## 2011-09-13 NOTE — Progress Notes (Signed)
At 0700 RN came to check on pt and noticed that her face and neck were pink.  No complaints of itching.  Checked temp:  98.7.  Pt has not received any new medications from RN and has not had Percocet since 0230.  Discussed with oncoming RN-per pt and family, Dr. Luisa Hart to come between 0700-0800.
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
## 2011-09-15 ENCOUNTER — Encounter (HOSPITAL_COMMUNITY): Payer: Self-pay | Admitting: Surgery

## 2011-09-18 ENCOUNTER — Telehealth (INDEPENDENT_AMBULATORY_CARE_PROVIDER_SITE_OTHER): Payer: Self-pay

## 2011-09-18 NOTE — Telephone Encounter (Signed)
Patient notified of pathology results

## 2011-09-18 NOTE — Progress Notes (Signed)
Patient notified of pathology results

## 2011-09-20 ENCOUNTER — Telehealth (INDEPENDENT_AMBULATORY_CARE_PROVIDER_SITE_OTHER): Payer: Self-pay | Admitting: General Surgery

## 2011-09-20 NOTE — Telephone Encounter (Signed)
Pt calling for pain meds, but asking for less-strong!  Called in Hydrocodone 5/325 mg, # 30, 1 po Q4-6H prn pain, NO refill.  Called pharmacist at [facility]-:  [phone].

## 2011-09-22 ENCOUNTER — Ambulatory Visit (INDEPENDENT_AMBULATORY_CARE_PROVIDER_SITE_OTHER): Payer: Medicare HMO | Admitting: Surgery

## 2011-09-22 ENCOUNTER — Encounter (INDEPENDENT_AMBULATORY_CARE_PROVIDER_SITE_OTHER): Payer: Medicare HMO | Admitting: Surgery

## 2011-09-22 ENCOUNTER — Encounter (INDEPENDENT_AMBULATORY_CARE_PROVIDER_SITE_OTHER): Payer: Self-pay | Admitting: Surgery

## 2011-09-22 VITALS — BP 108/62 | HR 64 | Temp 98.6°F | Resp 12 | Ht 61.0 in | Wt 129.4 lb

## 2011-09-22 DIAGNOSIS — Z9889 Other specified postprocedural states: Secondary | ICD-10-CM

## 2011-09-22 NOTE — Patient Instructions (Signed)
Exercises Following Breast Surgery Following all surgeries on the breast or axillary nodes, whether you had radiation treatment or not, exercises may help you return to your normal activities and way of life sooner. Before beginning any exercise, talk to your caregiver about what type of exercises will be best for you. Your caregiver may recommend getting physical therapy to help you, especially if you do not see any progress in a month of exercising. Some light exercises can be done right after the surgery, but any drains and sutures will be removed before doing the extended or heavy exercises. Generally, the exercises will lessen problems following the surgery. You can usually expect to have full range of motion of your arm back in 4 to 6 weeks.  HOME CARE INSTRUCTIONS  These exercises should be done for the first 3 to 7 days after surgery, but only with your doctor's permission.   Use your affected arm (on the side where your surgery was) as you normally would when combing your hair, bathing, dressing and eating.   Raise your affected arm above the level of your heart for 45 minutes, 2 to 3 times a day, while lying down. Put your arm on pillows so that your hand is higher than your wrist and your elbow is a little higher than your shoulder. This will help decrease the swelling that may happen after surgery.   Exercise your affected arm while it is elevated above the level of your heart by opening and closing your hand 15 to 25 times. Then, bend and straighten your elbow. Repeat this 3 to 4 times a day. This exercise helps reduce swelling by pumping lymph fluid out of your arm.   Practice deep breathing exercises (using your diaphragm) at least 6 times each day. While lying on your back, take a slow, deep breath. Breathe in as much air as you can while trying to expand your chest and abdomen (push your belly button away from your spine). Relax and breathe out. Repeat this 4 or 5 times. This exercise  will help maintain normal movement of your chest, making it easier for your lungs to expand. Continue to do deep breathing exercises from now on.   Avoid sleeping on your affected arm or on that side.   Do not lift anything over 5 pounds.   Stop exercising if you develop pain in your chest, arm or shoulder, and call your caregiver.   Let your caregiver or therapist know if your arm becomes swollen after exercising.   Exercise in front of a mirror. This way you can see yourself exercising in a correct posture and using the correct motion that is recommended.   Do not use a heating pad on your arm of the side of the surgery.  GENERAL GUIDELINES FOR EXERCISE The exercises described here can be done as soon as your doctor gives you permission. Be sure to talk to your doctor before trying any of them.   You will feel some tightness in your chest and armpit after surgery. This is normal. The tightness will decrease as you continue your exercise program.   Many women have a burning, tingling, numbness, or soreness on the back of the arm and/or chest wall. This is because the surgery irritated some of your nerve endings. Although the sensations may increase a few weeks after surgery, continue to do the exercises unless you notice unusual swelling or tenderness. (Tell your caregiver if this occurs.) Sometimes rubbing or stroking the area with   your hand or a soft cloth can help make the area less sensitive.   It may be helpful to do exercises after a warm shower when muscles are warm and relaxed.   Wear comfortable, loose clothing when doing the exercises.   Do the exercises until you feel a slow stretch. Hold each stretch at the end of the motion for a count of five. It is normal to feel some pulling as you stretch the skin and muscles that have been shortened because of the surgery. Do not do bouncing or jerky-type movements when doing any of the exercises. You should not feel pain as you do the  exercises, only gentle stretching.   Do 5 to 7 repetitions of each exercise. Try to do each exercise correctly. If you have difficulty with the exercises, contact your doctor. You may need to be referred to a physical or occupational therapist.   Exercises should be done twice a day until you regain normal flexibility and strength.   Be sure to take deep breaths, in and out, as you perform each exercise.   The exercises are designed so that you begin them lying down, move to sitting, and then finish standing.  EXERCISES IN LYING POSITION These exercises should be performed on a bed or on the floor while lying on your back. Keep your knees and hips bent and feet flat.  Wand Exercise This exercise helps increase the forward motion of the shoulders. You will need a broom handle, yardstick, or other similar object to perform this exercise.   Hold the wand in both hands with palms facing up.   Lift the wand up over your head (as far as you can) using your unaffected arm to help lift the wand, until you feel a stretch in your affected arm.   Hold for five seconds.   Lower arms and repeat 5 to 7 times.  Elbow Winging This exercise helps increase the mobility of the front of your chest and shoulder. It may take several weeks of regular exercise before your elbows will get close to the bed (or floor).   Clasp your hands behind your neck with your elbows pointing toward the ceiling.   Move your elbows apart and down toward the bed (or floor).   Repeat 5 to 7 times.  EXERCISES IN SITTING POSITION Shoulder Blade Stretch This exercise helps increase the mobility of the shoulder blades.   Sit in a chair very close to a table.   Place the unaffected arm on the table with your elbow bent and palm down. Do not move this arm during the exercise.   Place the affected arm on the table, palm down with your elbow straight.   Without moving your trunk, slide the affected arm toward the opposite side  of the table. You should feel your shoulder blade move as you do this.   Relax your arm and repeat 5 to 7 times.  Shoulder Blade Squeeze This exercise also helps increase the mobility of the shoulder blade.   Facing straight ahead, sit in a chair in front of a mirror without resting on the back of the chair.   Arms should be at your sides with elbows bent.   Squeeze shoulder blades together, bringing your elbows behind you. Keep your shoulders level as you do this exercise. Do not lift your shoulders up toward your ears.   Return to the starting position and repeat 5 to 7 times.  Side Bending This exercise helps   increase the mobility of the trunk/body.   Clasp your hands together in front of you and lift your arms slowly over your head, straightening your arms.   When your arms are over your head, bend your trunk to the right while bending at the waist and keeping your arms overhead.   Return to the starting position and bend to the left.   Repeat 5 to 7 times.  EXERCISES IN STANDING POSITION Chest Wall Stretch This exercise helps stretch the chest wall.   Stand facing a corner with toes approximately 8 to 10 inches from the corner.   Bend your elbows and place forearms on the wall, one on each side of the corner. Your elbows should be as close to shoulder height as possible.   Keep your arms and feet in position and move your chest toward the corner. You will feel a stretch across your chest and shoulders.   Return to starting position and repeat 5 to 7 times.  Shoulder Stretch This exercise helps increase the mobility in the shoulder.   Stand facing the wall with your toes approximately 8 to 10 inches from the wall.   Place your hands on the wall. Use your fingers to "climb the wall," reaching as high as you can until you feel a stretch.   Return to starting position and repeat 5 to 7 times.  THINGS TO KEEP IN MIND  Begin exercising slowly and keep going as you are able.  Stop exercising and call your caregiver if you:  Get weaker, start losing your balance or start falling.   Have pain that gets worse.   Have new heaviness in your arm.   Have unusual swelling, or swelling gets worse.   Have headaches, dizziness, blurred vision, new numbness or tingling in arms or chest.  It is important to exercise to keep muscles working as well as possible, but it is also important to be safe. Talk with your caregiver about realistic exercises for your condition. Then set goals for increasing your physical activity level.  Document Released: 11/14/2005 Document Revised: 04/13/2011 Document Reviewed: 06/08/2008 [employer] Patient Information 2012 [employer] 

## 2011-09-22 NOTE — Progress Notes (Signed)
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 She underwent a bilateral mastectomy and SLN mapping on 09/06/2011. Over all she feels that she is doing well.   PE: The incisions are healing nicely and there is no evidence of infection or hematoma.  The drains are removed.  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 Extensive intraductal component: Absent Lobular neoplasia: Absent Tumor focality: Unifocal Treatment effect: None If present, treatment effect in breast tissue, lymph nodes or both: N/A Extent of tumor: Skin: Grossly negative Nipple: Negative for tumor Skeletal muscle: N/A Lymph nodes: # examined: 3 Lymph nodes with metastasis: 0 Breast prognostic profile: Estrogen receptor: Repeated, previous study demonstrated 0% positivity (in situ carcinoma only)([id number]) Progesterone receptor: Repeated, previous study demonstrated 4% positivity (in situ carcinoma only ) ([id number]) Her 2 neu: Pending and will be reported in an addendum Ki-67: Pending and will be reported in an addendum Non-neoplastic breast: Previous biopsy site, benign fibrocystic change, sclerosing adenosis, and coarse vascular calcifications TNM: pT1c, pN0, pMX Comments: The invasive and insitu carcinoma demonstrate strong, diffuse E-cadherin immunostain expression; confirming the ductal origin. 8. 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 Extensive intraductal component: lobular neoplasia Lobular neoplasia: Absent Tumor focality: Unifocal Treatment effect: None If present, treatment effect in breast tissue, lymph nodes or both: N/A Extent of tumor: Skin: Grossly negative for tumor Nipple: Negative for tumor Skeletal muscle: N/A Lymph nodes: # examined: 3 Lymph nodes with metastasis: 0 Breast prognostic profile: Estrogen receptor: Not repeated, previous study demonstrated 100% positivity ([id number]) Progesterone receptor: Not repeated, previous study demonstrated 100% positivity ([id number]) Her 2 neu: Repeated, previous study demonstrated no amplification (1.28) ([id number]) Ki-67: Not repeated, previous study demonstrated 19% proliferation rate ([id number]) Non-neoplastic breast: Previous biopsy, coarse vascular calcifications, and fibrocystic change TNM: pT1c pN0 pMX   IMPRESSION: Patient doing well.   PLAN: Her next visit will be in 6 - 8 WEEKS.SHE WILL FOLLOW UP WITH HER MEDICAL ONCOLOGIST.

## 2011-10-03 ENCOUNTER — Encounter (INDEPENDENT_AMBULATORY_CARE_PROVIDER_SITE_OTHER): Payer: Self-pay | Admitting: Surgery

## 2011-10-03 ENCOUNTER — Ambulatory Visit (INDEPENDENT_AMBULATORY_CARE_PROVIDER_SITE_OTHER): Payer: Medicare HMO | Admitting: Surgery

## 2011-10-03 VITALS — BP 130/88 | HR 60 | Temp 98.3°F | Resp 14 | Ht 59.0 in | Wt 126.6 lb

## 2011-10-03 DIAGNOSIS — Z9889 Other specified postprocedural states: Secondary | ICD-10-CM

## 2011-10-03 NOTE — Patient Instructions (Signed)
Return 6 months.  Physical therapy should contact you in next 2 - 3 days.

## 2011-10-03 NOTE — Progress Notes (Signed)
KARRY BARRILLEAUX    [id number] 10/03/2011    06-15-1934   CC: Post op BILATERAL MASTECTOMY STAGE 1 BILATERAL BREAST CANCER  HPI: The patient returns for post op follow-up. She underwent a bilateral mastectomy and SLN mapping on 09/06/2011. Over all she feels that she is doing well.   PE: The incisions are healing nicely and there is no evidence of infection or hematoma. ROM excellent. 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 Extensive intraductal component: Absent Lobular neoplasia: Absent Tumor focality: Unifocal Treatment effect: None If present, treatment effect in breast tissue, lymph nodes or both: N/A Extent of tumor: Skin: Grossly negative Nipple: Negative for tumor Skeletal muscle: N/A Lymph nodes: # examined: 3 Lymph nodes with metastasis: 0 Breast prognostic profile: Estrogen receptor: Repeated, previous study demonstrated 0% positivity (in situ carcinoma only)([id number]) Progesterone receptor: Repeated, previous study demonstrated 4% positivity (in situ carcinoma only ) ([id number]) Her 2 neu: Pending and will be reported in an addendum Ki-67: Pending and will be reported in an addendum Non-neoplastic breast: Previous biopsy site, benign fibrocystic change, sclerosing adenosis, and coarse vascular calcifications TNM: pT1c, pN0, pMX Comments: The invasive and insitu carcinoma demonstrate strong, diffuse E-cadherin immunostain expression; confirming the ductal origin. 8. 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 Extensive intraductal component: lobular neoplasia Lobular neoplasia: Absent Tumor focality: Unifocal Treatment effect: None If present, treatment effect in breast tissue, lymph nodes or both: N/A Extent of tumor: Skin: Grossly negative for tumor Nipple: Negative for tumor Skeletal muscle: N/A Lymph nodes: # examined: 3 Lymph nodes with metastasis: 0 Breast prognostic profile: Estrogen receptor: Not repeated, previous study demonstrated 100% positivity ([id number]) Progesterone receptor: Not repeated, previous study demonstrated 100% positivity ([id number]) Her 2 neu: Repeated, previous study demonstrated no amplification (1.28) ([id number]) Ki-67: Not repeated, previous study demonstrated 19% proliferation rate ([id number]) Non-neoplastic breast: Previous biopsy, coarse vascular calcifications, and fibrocystic change TNM: pT1c pN0 pMX   IMPRESSION: Patient doing well.   PLAN: Her next visit will be in 6 months .SHE WILL FOLLOW UP WITH HER MEDICAL ONCOLOGIST.

## 2011-11-16 ENCOUNTER — Encounter (INDEPENDENT_AMBULATORY_CARE_PROVIDER_SITE_OTHER): Payer: Medicare HMO | Admitting: Surgery

## 2011-11-17 ENCOUNTER — Encounter (INDEPENDENT_AMBULATORY_CARE_PROVIDER_SITE_OTHER): Payer: Self-pay | Admitting: Surgery

## 2011-11-17 ENCOUNTER — Ambulatory Visit (INDEPENDENT_AMBULATORY_CARE_PROVIDER_SITE_OTHER): Payer: Medicare HMO | Admitting: Surgery

## 2011-11-17 VITALS — BP 142/90 | HR 60 | Temp 97.6°F | Resp 16 | Ht 60.5 in | Wt 128.4 lb

## 2011-11-17 DIAGNOSIS — Z9889 Other specified postprocedural states: Secondary | ICD-10-CM

## 2011-11-17 NOTE — Progress Notes (Signed)
Wanda Murillo    [id number] 11/17/2011    10/09/1934   CC: Post op BILATERAL MASTECTOMY STAGE 1 BILATERAL BREAST CANCER  HPI: The patient returns for post op follow-up. She underwent a bilateral mastectomy and SLN mapping on 09/06/2011. Over all she feels that she is doing well.   PE: The incisions are healing nicely and there is no evidence of infection or hematoma. ROM excellent. 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 Extensive intraductal component: Absent Lobular neoplasia: Absent Tumor focality: Unifocal Treatment effect: None If present, treatment effect in breast tissue, lymph nodes or both: N/A Extent of tumor: Skin: Grossly negative Nipple: Negative for tumor Skeletal muscle: N/A Lymph nodes: # examined: 3 Lymph nodes with metastasis: 0 Breast prognostic profile: Estrogen receptor: Repeated, previous study demonstrated 0% positivity (in situ carcinoma only)([id number]) Progesterone receptor: Repeated, previous study demonstrated 4% positivity (in situ carcinoma only ) ([id number]) Her 2 neu: Pending and will be reported in an addendum Ki-67: Pending and will be reported in an addendum Non-neoplastic breast: Previous biopsy site, benign fibrocystic change, sclerosing adenosis, and coarse vascular calcifications TNM: pT1c, pN0, pMX Comments: The invasive and insitu carcinoma demonstrate strong, diffuse E-cadherin immunostain expression; confirming the ductal origin. 8. 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 Extensive intraductal component: lobular neoplasia Lobular neoplasia: Absent Tumor focality: Unifocal Treatment effect: None If present, treatment effect in breast tissue, lymph nodes or both: N/A Extent of tumor: Skin: Grossly negative for tumor Nipple: Negative for tumor Skeletal muscle: N/A Lymph nodes: # examined: 3 Lymph nodes with metastasis: 0 Breast prognostic profile: Estrogen receptor: Not repeated, previous study demonstrated 100% positivity ([id number]) Progesterone receptor: Not repeated, previous study demonstrated 100% positivity ([id number]) Her 2 neu: Repeated, previous study demonstrated no amplification (1.28) ([id number]) Ki-67: Not repeated, previous study demonstrated 19% proliferation rate ([id number]) Non-neoplastic breast: Previous biopsy, coarse vascular calcifications, and fibrocystic change TNM: pT1c pN0 pMX   IMPRESSION: Patient doing well.   PLAN: Her next visit will be in 6 months .SHE WILL FOLLOW UP WITH HER MEDICAL ONCOLOGIST.  Script for PT and prosthesis given

## 2012-03-06 ENCOUNTER — Encounter (INDEPENDENT_AMBULATORY_CARE_PROVIDER_SITE_OTHER): Payer: Self-pay | Admitting: Surgery

## 2012-03-07 ENCOUNTER — Other Ambulatory Visit: Payer: Self-pay | Admitting: Cardiology

## 2012-03-07 NOTE — Telephone Encounter (Signed)
..   Requested Prescriptions   Pending Prescriptions Disp Refills  . lisinopril (PRINIVIL,ZESTRIL) 10 MG tablet [Pharmacy Med Name: LISINOPRIL 10MG      TAB] 90 tablet 0    Sig: TAKE ONE TABLET BY MOUTH DAILY

## 2012-04-22 ENCOUNTER — Ambulatory Visit (INDEPENDENT_AMBULATORY_CARE_PROVIDER_SITE_OTHER): Payer: Medicare HMO | Admitting: Surgery

## 2012-04-26 ENCOUNTER — Encounter (INDEPENDENT_AMBULATORY_CARE_PROVIDER_SITE_OTHER): Payer: Self-pay | Admitting: Surgery

## 2012-04-26 ENCOUNTER — Ambulatory Visit (INDEPENDENT_AMBULATORY_CARE_PROVIDER_SITE_OTHER): Payer: Medicare HMO | Admitting: Surgery

## 2012-04-26 VITALS — BP 128/72 | HR 70 | Temp 97.4°F | Resp 16 | Ht 61.0 in | Wt 127.4 lb

## 2012-04-26 DIAGNOSIS — Z853 Personal history of malignant neoplasm of breast: Secondary | ICD-10-CM

## 2012-04-26 NOTE — Patient Instructions (Signed)
Return 6 months

## 2012-04-26 NOTE — Progress Notes (Signed)
COLE KLUGH    [id number] 04/26/2012    1934/12/23   CC: Post op BILATERAL MASTECTOMY STAGE 1 BILATERAL BREAST CANCER  HPI: The patient returns for post op follow-up. She underwent a bilateral mastectomy and SLN mapping on 09/06/2011. Over all she feels that she is doing well.   PE: The incisions are healing nicely and there is no evidence of infection or hematoma. ROM excellent. No lymphedema No masses or axillary adenopathy bilaterally .  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 Extensive intraductal component: Absent Lobular neoplasia: Absent Tumor focality: Unifocal Treatment effect: None If present, treatment effect in breast tissue, lymph nodes or both: N/A Extent of tumor: Skin: Grossly negative Nipple: Negative for tumor Skeletal muscle: N/A Lymph nodes: # examined: 3 Lymph nodes with metastasis: 0 Breast prognostic profile: Estrogen receptor: Repeated, previous study demonstrated 0% positivity (in situ carcinoma only)([id number]) Progesterone receptor: Repeated, previous study demonstrated 4% positivity (in situ carcinoma only ) ([id number]) Her 2 neu: Pending and will be reported in an addendum Ki-67: Pending and will be reported in an addendum Non-neoplastic breast: Previous biopsy site, benign fibrocystic change, sclerosing adenosis, and coarse vascular calcifications TNM: pT1c, pN0, pMX Comments: The invasive and insitu carcinoma demonstrate strong, diffuse E-cadherin immunostain expression; confirming the ductal origin. 8. 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 Extensive intraductal component: lobular neoplasia Lobular neoplasia: Absent Tumor focality: Unifocal Treatment effect: None If present, treatment effect in breast tissue, lymph nodes or both: N/A Extent of tumor: Skin: Grossly negative for tumor Nipple: Negative for tumor Skeletal muscle: N/A Lymph nodes: # examined: 3 Lymph nodes with metastasis: 0 Breast prognostic profile: Estrogen receptor: Not repeated, previous study demonstrated 100% positivity ([id number]) Progesterone receptor: Not repeated, previous study demonstrated 100% positivity ([id number]) Her 2 neu: Repeated, previous study demonstrated no amplification (1.28) ([id number]) Ki-67: Not repeated, previous study demonstrated 19% proliferation rate ([id number]) Non-neoplastic breast: Previous biopsy, coarse vascular calcifications, and fibrocystic change TNM: pT1c pN0 pMX   IMPRESSION: Patient doing well.   PLAN: Her next visit will be in 6 months .SHE WILL FOLLOW UP WITH HER MEDICAL ONCOLOGIST.  Script for PT and prosthesis given

## 2012-08-16 ENCOUNTER — Other Ambulatory Visit: Payer: Self-pay

## 2012-08-16 ENCOUNTER — Telehealth: Payer: Self-pay

## 2012-08-16 ENCOUNTER — Encounter (INDEPENDENT_AMBULATORY_CARE_PROVIDER_SITE_OTHER): Payer: Self-pay | Admitting: Surgery

## 2012-08-16 MED ORDER — LISINOPRIL 10 MG PO TABS: 10.0000 mg | ORAL_TABLET | Freq: Every day | ORAL | Status: DC

## 2012-08-16 NOTE — Telephone Encounter (Signed)
Pt given an appt to see Dr. Myrtis Ser on 6/2 @ 2:15, she agreed with date and time of appt.

## 2012-08-16 NOTE — Telephone Encounter (Signed)
Ms Peto called because she needed a refill of her Lisinopril 10 mg and I told her I could refill it for 30 tablets with 1 refill and she had to make an appointment to receive further refills. She then stated that Dr Myrtis Ser told her she did not have to return for 2 years to see him and I explained that in her last office visit it stated she was to follow up in 1 year. She would like a call back from Dr. Myrtis Ser nurse. She stated she should get further refills since it has not been 2 years and [facility] should [facility] to [facility]. Will route message to Dr. Myrtis Ser nurse.  [phone]

## 2012-10-04 ENCOUNTER — Encounter: Payer: Self-pay | Admitting: Cardiology

## 2012-10-04 ENCOUNTER — Ambulatory Visit (INDEPENDENT_AMBULATORY_CARE_PROVIDER_SITE_OTHER): Payer: Medicare Other | Admitting: Cardiology

## 2012-10-04 ENCOUNTER — Other Ambulatory Visit: Payer: Self-pay

## 2012-10-04 VITALS — BP 125/57 | HR 61 | Ht 61.0 in | Wt 127.8 lb

## 2012-10-04 DIAGNOSIS — I10 Essential (primary) hypertension: Secondary | ICD-10-CM

## 2012-10-04 DIAGNOSIS — I498 Other specified cardiac arrhythmias: Secondary | ICD-10-CM

## 2012-10-04 DIAGNOSIS — R001 Bradycardia, unspecified: Secondary | ICD-10-CM

## 2012-10-04 DIAGNOSIS — I2510 Atherosclerotic heart disease of native coronary artery without angina pectoris: Secondary | ICD-10-CM

## 2012-10-04 MED ORDER — LISINOPRIL 10 MG PO TABS: 10.0000 mg | ORAL_TABLET | Freq: Every day | ORAL | Status: DC
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
## 2012-10-04 NOTE — Assessment & Plan Note (Signed)
Blood pressure is controlled. No change in therapy. 

## 2012-10-04 NOTE — Patient Instructions (Addendum)
Your physician recommends that you continue on your current medications as directed. Please refer to the Current Medication list given to you today.  Your physician wants you to follow-up in: 1 year. You will receive a reminder letter in the mail two months in advance. If you don't receive a letter, please call our office to schedule the follow-up appointment.  

## 2012-10-04 NOTE — Assessment & Plan Note (Signed)
Coronary disease is stable.  No further workup is needed. 

## 2012-10-04 NOTE — Assessment & Plan Note (Signed)
The patient has asymptomatic sinus bradycardia. No further workup. 

## 2012-10-07 ENCOUNTER — Ambulatory Visit: Payer: Medicare HMO | Admitting: Cardiology

## 2012-10-18 ENCOUNTER — Ambulatory Visit (INDEPENDENT_AMBULATORY_CARE_PROVIDER_SITE_OTHER): Payer: Medicare HMO | Admitting: Surgery

## 2012-10-18 ENCOUNTER — Encounter (INDEPENDENT_AMBULATORY_CARE_PROVIDER_SITE_OTHER): Payer: Self-pay | Admitting: Surgery

## 2012-10-18 VITALS — BP 130/76 | HR 56 | Temp 96.5°F | Ht 61.0 in | Wt 127.6 lb

## 2012-10-18 DIAGNOSIS — Z853 Personal history of malignant neoplasm of breast: Secondary | ICD-10-CM

## 2012-10-18 NOTE — Progress Notes (Signed)
BUENA BOEHM    [id number] 10/18/2012    [birth year]   CC: Post op BILATERAL MASTECTOMY STAGE 1 BILATERAL BREAST CANCER  HPI: The patient returns for post op follow-up. She underwent a bilateral mastectomy and SLN mapping on 09/06/2011. Over all she feels that she is doing well.   PE: The incisions are healing nicely and there is no evidence of infection or hematoma. ROM excellent. No lymphedema No masses or axillary adenopathy bilaterally .  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 Extensive intraductal component: Absent Lobular neoplasia: Absent Tumor focality: Unifocal Treatment effect: None If present, treatment effect in breast tissue, lymph nodes or both: N/A Extent of tumor: Skin: Grossly negative Nipple: Negative for tumor Skeletal muscle: N/A Lymph nodes: # examined: 3 Lymph nodes with metastasis: 0 Breast prognostic profile: Estrogen receptor: Repeated, previous study demonstrated 0% positivity (in situ carcinoma only)([id number]) Progesterone receptor: Repeated, previous study demonstrated 4% positivity (in situ carcinoma only ) ([id number]) Her 2 neu: Pending and will be reported in an addendum Ki-67: Pending and will be reported in an addendum Non-neoplastic breast: Previous biopsy site, benign fibrocystic change, sclerosing adenosis, and coarse vascular calcifications TNM: pT1c, pN0, pMX Comments: The invasive and insitu carcinoma demonstrate strong, diffuse E-cadherin immunostain expression; confirming the ductal origin. 8. BREAST, INVASIVE TUMOR, WITH LYMPH NODE  SAMPLING Specimen, including laterality: Left breast Procedure: Simple mastectomy Grade: I of III Tubule formation: 1 Nuclear pleomorphism: 1 Mitotic:1 Tumor size (gross measurement): 2.0 cm Margins: Invasive, distance to closest margin: 0.1 mm In-situ, distance to closest margin: 0.5 cm (deep) If margin positive, focally or broadly: N/A Lymphovascular invasion: Absent 3 of 6 FINAL for EVELEEN, MCNEAR ([id number]) Microscopic Comment(continued) Ductal carcinoma in situ: Present Grade: I of III Extensive intraductal component: lobular neoplasia Lobular neoplasia: Absent Tumor focality: Unifocal Treatment effect: None If present, treatment effect in breast tissue, lymph nodes or both: N/A Extent of tumor: Skin: Grossly negative for tumor Nipple: Negative for tumor Skeletal muscle: N/A Lymph nodes: # examined: 3 Lymph nodes with metastasis: 0 Breast prognostic profile: Estrogen receptor: Not repeated, previous study demonstrated 100% positivity ([id number]) Progesterone receptor: Not repeated, previous study demonstrated 100% positivity ([id number]) Her 2 neu: Repeated, previous study demonstrated no amplification (1.28) ([id number]) Ki-67: Not repeated, previous study demonstrated 19% proliferation rate ([id number]) Non-neoplastic breast: Previous biopsy, coarse vascular calcifications, and fibrocystic change TNM: pT1c pN0 pMX   IMPRESSION: Patient doing well.   PLAN: Her next visit will be in 12 months.  Opted against reconstruction

## 2012-10-18 NOTE — Patient Instructions (Signed)
Return 1 year. 

## 2013-10-03 ENCOUNTER — Other Ambulatory Visit: Payer: Self-pay

## 2013-10-03 MED ORDER — LISINOPRIL 10 MG PO TABS: 10.0000 mg | ORAL_TABLET | Freq: Every day | ORAL | Status: DC

## 2013-10-06 ENCOUNTER — Ambulatory Visit: Payer: Medicare HMO | Admitting: Cardiology

## 2013-10-08 ENCOUNTER — Other Ambulatory Visit: Payer: Self-pay

## 2013-10-08 MED ORDER — LISINOPRIL 10 MG PO TABS: 10.0000 mg | ORAL_TABLET | Freq: Every day | ORAL | Status: DC

## 2013-10-13 ENCOUNTER — Encounter (INDEPENDENT_AMBULATORY_CARE_PROVIDER_SITE_OTHER): Payer: Self-pay | Admitting: Surgery

## 2013-12-12 ENCOUNTER — Encounter: Payer: Self-pay | Admitting: Cardiology

## 2013-12-12 ENCOUNTER — Ambulatory Visit (INDEPENDENT_AMBULATORY_CARE_PROVIDER_SITE_OTHER): Payer: Medicare HMO | Admitting: Cardiology

## 2013-12-12 VITALS — BP 107/62 | HR 68 | Ht 61.0 in | Wt 125.0 lb

## 2013-12-12 DIAGNOSIS — R001 Bradycardia, unspecified: Secondary | ICD-10-CM

## 2013-12-12 DIAGNOSIS — I2510 Atherosclerotic heart disease of native coronary artery without angina pectoris: Secondary | ICD-10-CM

## 2013-12-12 DIAGNOSIS — E785 Hyperlipidemia, unspecified: Secondary | ICD-10-CM

## 2013-12-12 DIAGNOSIS — I498 Other specified cardiac arrhythmias: Secondary | ICD-10-CM

## 2013-12-12 MED ORDER — LISINOPRIL 10 MG PO TABS: 10.0000 mg | ORAL_TABLET | Freq: Every day | ORAL | Status: DC

## 2013-12-12 NOTE — Assessment & Plan Note (Signed)
Coronary disease is stable. She does not need any type of exercise testing at this time.

## 2013-12-12 NOTE — Assessment & Plan Note (Signed)
Her lipids were checked by her primary physician. She tells me they have been good. The current guidelines would consider recommending a higher dose of statin. However she's greater than 79 years of age and she tolerates her meds very well. If she is getting very good LDL control with her current dose of Pravachol, it would be reasonable to leave her on the same meds. If not it would be reasonable to consider changing her to atorvastatin. I do agree that it is very reasonable to stop her fish oil at this time.
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
## 2013-12-12 NOTE — Patient Instructions (Signed)
**Note De-Identified  Obfuscation** Your physician has recommended you make the following change in your medication: stop taking Fish oil  Your physician wants you to follow-up in: 1 year. You will receive a reminder letter in the mail two months in advance. If you don't receive a letter, please call our office to schedule the follow-up appointment.

## 2013-12-12 NOTE — Assessment & Plan Note (Addendum)
She does not have any symptomatic bradycardia. No change in therapy.Marland KitchenMarland KitchenMarland Kitchen

## 2014-04-07 IMAGING — CR DG CHEST 2V
2 series · 2 of 2 positions shown · non-contrast
Comparison: None.

CLINICAL DATA: Shortness of breath.  Previous myocardial infarct.
Asthma.  Preop respiratory exam for newly diagnosed breast
carcinoma

CHEST - 2 VIEW

[view not recorded (1 of 2)]
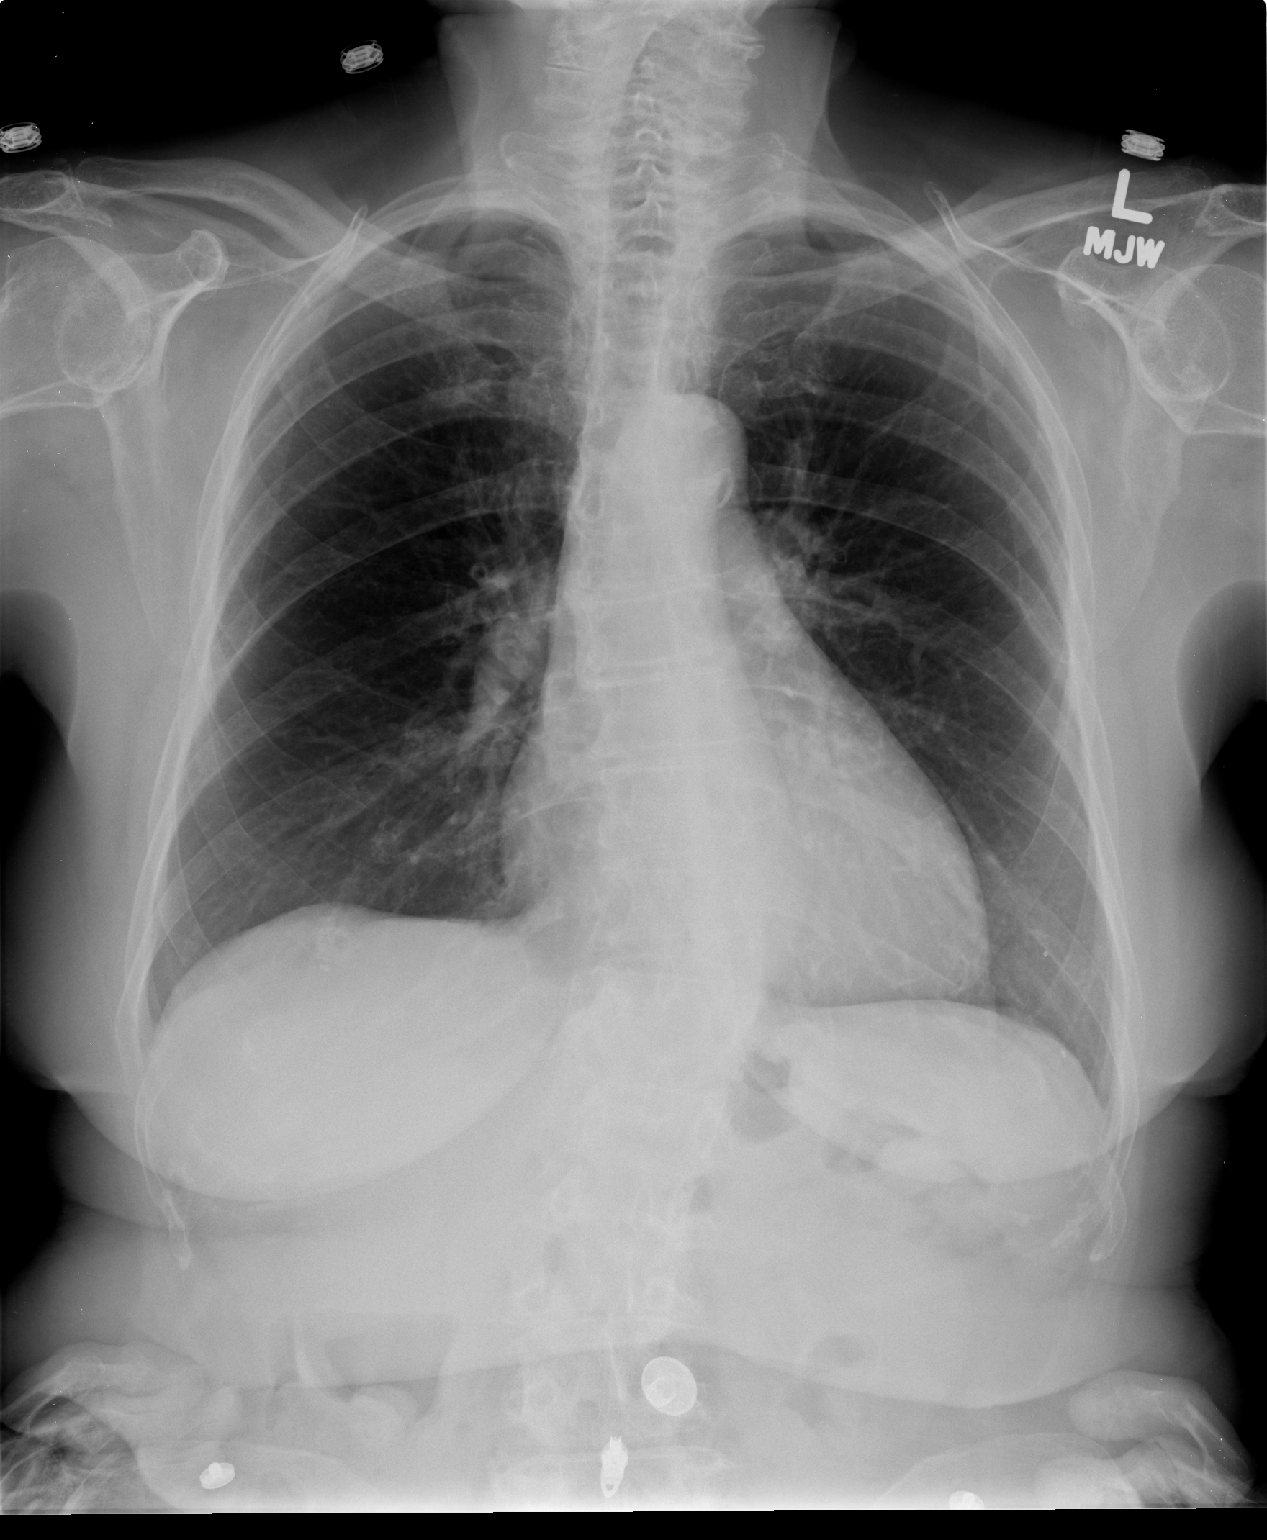

[view not recorded (2 of 2)]
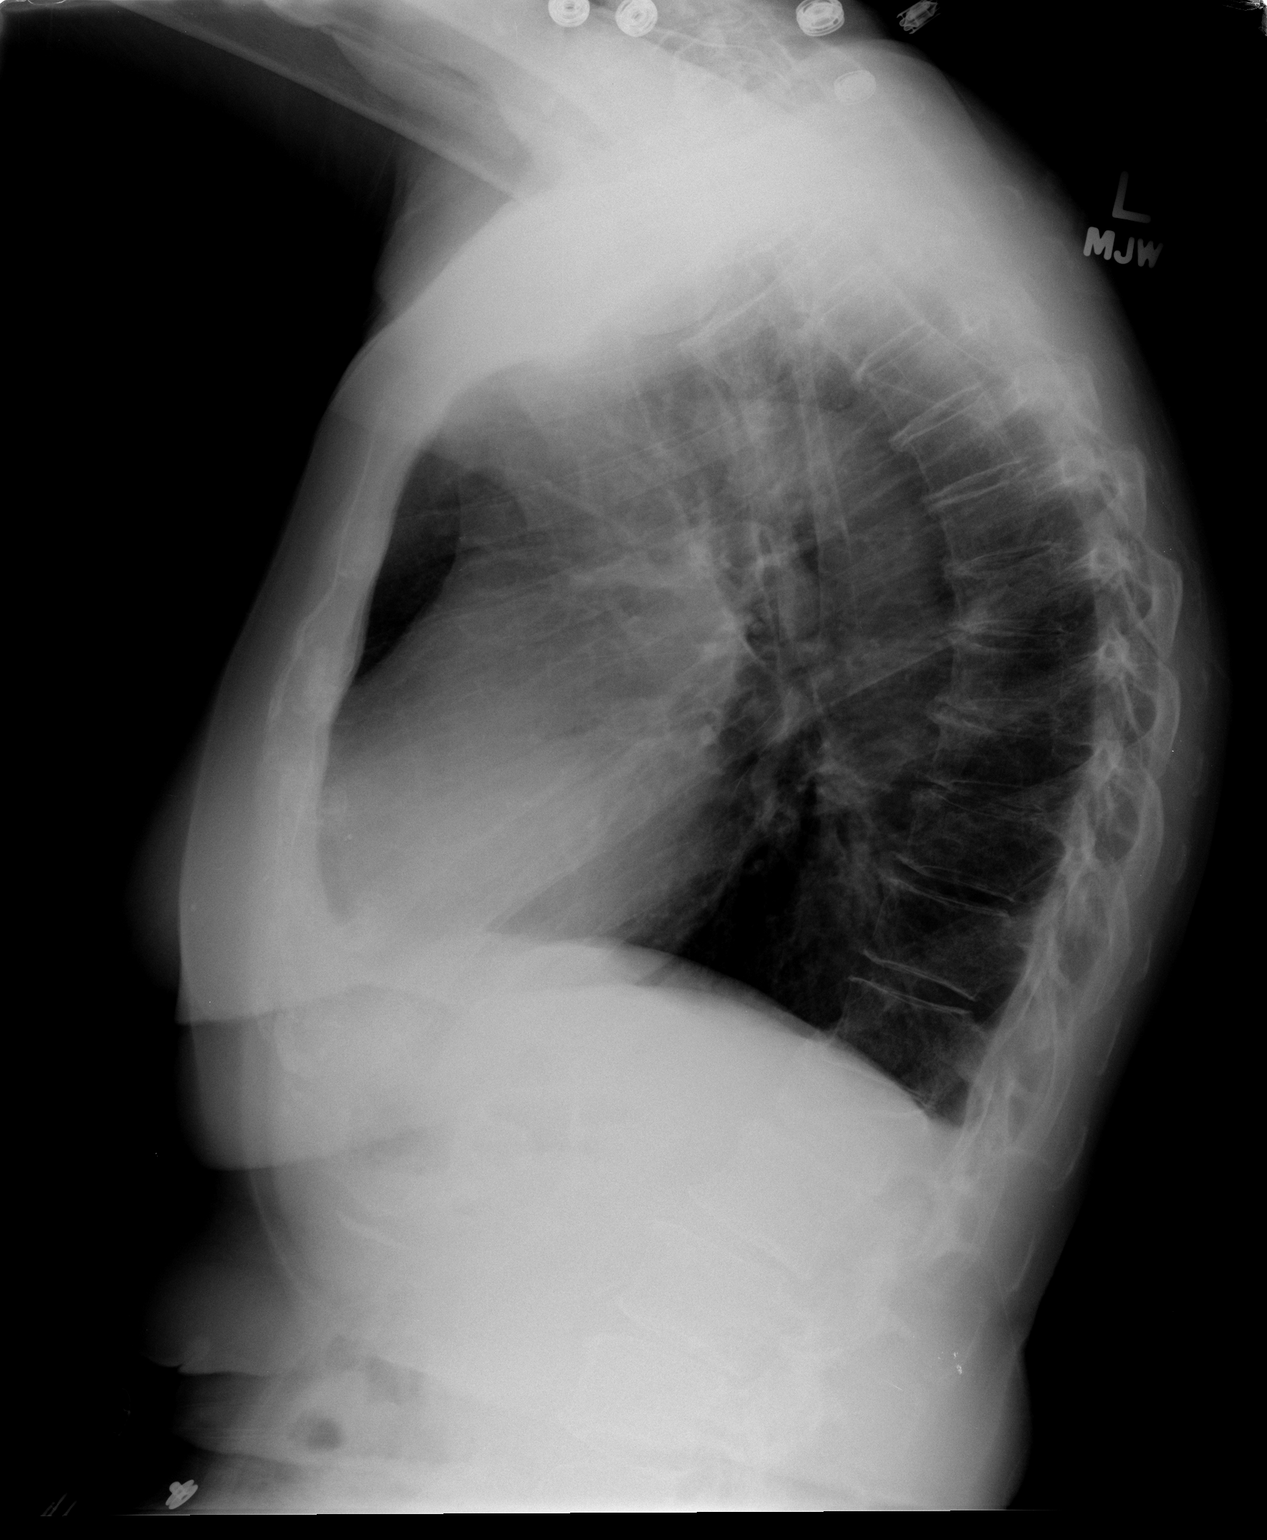

[2 of 2 positions shown; findings below may reference images not displayed]

FINDINGS: Heart size is at the upper limits of normal.  Both lungs
are clear.  No evidence of pleural effusion.  No mass or
lymphadenopathy identified.

Mild to moderate thoracic spine degenerative changes are seen as
well as mild thoracolumbar levoscoliosis.   Mild mid and lower
thoracic vertebral body compression fracture deformities are also
noted which are indeterminate age.
IMPRESSION: No active cardiopulmonary disease.

## 2014-05-15 ENCOUNTER — Telehealth: Payer: Self-pay | Admitting: *Deleted

## 2014-05-15 NOTE — Telephone Encounter (Signed)
Faxed BRCA results to [facility].

## 2014-06-04 ENCOUNTER — Other Ambulatory Visit: Payer: Self-pay | Admitting: Medical Oncology

## 2015-01-07 ENCOUNTER — Other Ambulatory Visit: Payer: Self-pay | Admitting: Cardiology

## 2015-02-05 ENCOUNTER — Ambulatory Visit (INDEPENDENT_AMBULATORY_CARE_PROVIDER_SITE_OTHER): Payer: Medicare HMO | Admitting: Cardiology

## 2015-02-05 ENCOUNTER — Encounter: Payer: Self-pay | Admitting: Cardiology

## 2015-02-05 VITALS — BP 136/72 | HR 59 | Ht 61.0 in | Wt 126.0 lb

## 2015-02-05 DIAGNOSIS — I2510 Atherosclerotic heart disease of native coronary artery without angina pectoris: Secondary | ICD-10-CM | POA: Diagnosis not present

## 2015-02-05 DIAGNOSIS — E785 Hyperlipidemia, unspecified: Secondary | ICD-10-CM

## 2015-02-05 DIAGNOSIS — R001 Bradycardia, unspecified: Secondary | ICD-10-CM | POA: Diagnosis not present

## 2015-02-05 MED ORDER — LISINOPRIL 10 MG PO TABS: 10.0000 mg | ORAL_TABLET | Freq: Every day | ORAL | Status: DC
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
## 2015-02-05 NOTE — Patient Instructions (Signed)
Medication Instructions:  Same-no changes  Labwork: None  Testing/Procedures: None  Follow-Up: Your physician wants you to follow-up in: 1 year with Dr Nelson. You will receive a reminder letter in the mail two months in advance. If you don't receive a letter, please call our office to schedule the follow-up appointment.     

## 2015-02-05 NOTE — Assessment & Plan Note (Signed)
Patient had PCI for an MI in 1998. She's done extremely well since then. Her stress echo in October, 2012 revealed no ischemia. No further workup.

## 2015-02-05 NOTE — Assessment & Plan Note (Signed)
The patient is on Pravachol. She is 80 years of age. I've chosen not to push to change her to atorvastatin. However it is important that she remains on Pravachol. No change in therapy.

## 2015-12-10 DIAGNOSIS — K219 Gastro-esophageal reflux disease without esophagitis: Secondary | ICD-10-CM | POA: Insufficient documentation

## 2015-12-10 DIAGNOSIS — M069 Rheumatoid arthritis, unspecified: Secondary | ICD-10-CM | POA: Insufficient documentation

## 2015-12-10 DIAGNOSIS — R7303 Prediabetes: Secondary | ICD-10-CM

## 2015-12-10 HISTORY — DX: Rheumatoid arthritis, unspecified: M06.9

## 2015-12-10 HISTORY — DX: Prediabetes: R73.03

## 2015-12-13 DIAGNOSIS — K635 Polyp of colon: Secondary | ICD-10-CM

## 2015-12-13 DIAGNOSIS — M8580 Other specified disorders of bone density and structure, unspecified site: Secondary | ICD-10-CM

## 2015-12-13 HISTORY — DX: Other specified disorders of bone density and structure, unspecified site: M85.80

## 2015-12-13 HISTORY — DX: Polyp of colon: K63.5

## 2016-01-28 ENCOUNTER — Other Ambulatory Visit: Payer: Self-pay | Admitting: Cardiology

## 2016-03-24 ENCOUNTER — Other Ambulatory Visit: Payer: Self-pay | Admitting: Cardiology

## 2016-06-12 DIAGNOSIS — Z79811 Long term (current) use of aromatase inhibitors: Secondary | ICD-10-CM | POA: Diagnosis not present

## 2016-06-12 DIAGNOSIS — Z170 Estrogen receptor positive status [ER+]: Secondary | ICD-10-CM | POA: Diagnosis not present

## 2016-06-12 DIAGNOSIS — C50912 Malignant neoplasm of unspecified site of left female breast: Secondary | ICD-10-CM | POA: Diagnosis not present

## 2016-06-28 ENCOUNTER — Encounter (HOSPITAL_COMMUNITY): Payer: Self-pay

## 2016-12-11 DIAGNOSIS — D649 Anemia, unspecified: Secondary | ICD-10-CM | POA: Diagnosis not present

## 2016-12-11 DIAGNOSIS — C50912 Malignant neoplasm of unspecified site of left female breast: Secondary | ICD-10-CM | POA: Diagnosis not present

## 2016-12-11 DIAGNOSIS — Z170 Estrogen receptor positive status [ER+]: Secondary | ICD-10-CM | POA: Diagnosis not present

## 2016-12-11 DIAGNOSIS — M8580 Other specified disorders of bone density and structure, unspecified site: Secondary | ICD-10-CM | POA: Diagnosis not present

## 2016-12-11 DIAGNOSIS — Z79811 Long term (current) use of aromatase inhibitors: Secondary | ICD-10-CM | POA: Diagnosis not present

## 2016-12-11 DIAGNOSIS — Z9013 Acquired absence of bilateral breasts and nipples: Secondary | ICD-10-CM | POA: Diagnosis not present

## 2017-02-27 DIAGNOSIS — Z1211 Encounter for screening for malignant neoplasm of colon: Secondary | ICD-10-CM

## 2017-02-27 HISTORY — DX: Encounter for screening for malignant neoplasm of colon: Z12.11

## 2017-06-20 DIAGNOSIS — F331 Major depressive disorder, recurrent, moderate: Secondary | ICD-10-CM | POA: Insufficient documentation

## 2017-06-20 HISTORY — DX: Major depressive disorder, recurrent, moderate: F33.1

## 2017-08-14 ENCOUNTER — Ambulatory Visit: Payer: Medicare HMO | Admitting: Cardiology

## 2017-09-18 DIAGNOSIS — Z7982 Long term (current) use of aspirin: Secondary | ICD-10-CM

## 2017-09-18 HISTORY — DX: Long term (current) use of aspirin: Z79.82

## 2017-10-14 DIAGNOSIS — I272 Pulmonary hypertension, unspecified: Secondary | ICD-10-CM

## 2017-10-14 HISTORY — DX: Pulmonary hypertension, unspecified: I27.20

## 2018-05-06 DIAGNOSIS — R413 Other amnesia: Secondary | ICD-10-CM

## 2018-05-06 DIAGNOSIS — G301 Alzheimer's disease with late onset: Secondary | ICD-10-CM | POA: Insufficient documentation

## 2018-05-06 HISTORY — DX: Alzheimer's disease with late onset: G30.1

## 2018-05-06 HISTORY — DX: Other amnesia: R41.3

## 2018-11-12 DIAGNOSIS — M545 Low back pain, unspecified: Secondary | ICD-10-CM | POA: Insufficient documentation

## 2018-11-12 DIAGNOSIS — G8929 Other chronic pain: Secondary | ICD-10-CM

## 2018-11-12 HISTORY — DX: Other chronic pain: G89.29

## 2018-11-17 DIAGNOSIS — Z8249 Family history of ischemic heart disease and other diseases of the circulatory system: Secondary | ICD-10-CM

## 2018-11-17 HISTORY — DX: Family history of ischemic heart disease and other diseases of the circulatory system: Z82.49

## 2018-12-20 DIAGNOSIS — R2689 Other abnormalities of gait and mobility: Secondary | ICD-10-CM

## 2018-12-20 HISTORY — DX: Other abnormalities of gait and mobility: R26.89

## 2019-02-05 ENCOUNTER — Ambulatory Visit: Payer: Medicare HMO | Admitting: Neurology

## 2019-03-20 ENCOUNTER — Ambulatory Visit: Payer: Medicare HMO | Admitting: Neurology

## 2019-06-26 ENCOUNTER — Ambulatory Visit: Payer: Medicare HMO | Admitting: Neurology

## 2019-08-04 ENCOUNTER — Telehealth: Payer: Self-pay | Admitting: Neurology

## 2019-08-04 ENCOUNTER — Ambulatory Visit: Payer: Medicare HMO | Admitting: Neurology

## 2019-08-04 ENCOUNTER — Other Ambulatory Visit: Payer: Self-pay

## 2019-08-04 ENCOUNTER — Encounter: Payer: Self-pay | Admitting: Neurology

## 2019-08-04 VITALS — BP 120/61 | HR 59 | Temp 97.0°F | Ht <= 58 in | Wt 122.0 lb

## 2019-08-04 DIAGNOSIS — R413 Other amnesia: Secondary | ICD-10-CM

## 2019-08-04 DIAGNOSIS — E538 Deficiency of other specified B group vitamins: Secondary | ICD-10-CM

## 2019-08-04 MED ORDER — DONEPEZIL HCL 5 MG PO TABS: 5.0000 mg | ORAL_TABLET | Freq: Every day | ORAL | 0 refills | Status: DC

## 2019-08-04 NOTE — Patient Instructions (Signed)
We will start the aricept for the memory.   Begin Aricept (donepezil) at 5 mg at night for one month. If this medication is well-tolerated, please call our office and we will call in a prescription for the 10 mg tablets. Look out for side effects that may include nausea, diarrhea, weight loss, or stomach cramps. This medication will also cause a runny nose, therefore there is no need for allergy medications for this purpose.

## 2019-08-04 NOTE — Telephone Encounter (Signed)
Mcarthur Rossetti Josem Kaufmann: [id number] (exp. 08/04/19 to 09/03/19) order faxed to [facility] bc they were who was in their network. Ph # [phone].
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
## 2019-08-05 ENCOUNTER — Telehealth: Payer: Self-pay | Admitting: *Deleted

## 2019-08-05 LAB — SEDIMENTATION RATE: Sed Rate: 2 mm/hr (ref 0–40)

## 2019-08-05 LAB — RPR: RPR Ser Ql: NONREACTIVE

## 2019-08-05 LAB — VITAMIN B12: Vitamin B-12: 474 pg/mL (ref 232–1245)

## 2019-08-05 NOTE — Telephone Encounter (Signed)
Called and spoke with pt about results. She verbalized understanding. Harriett Sine, daughter on [place] and relayed results as well per pt request. She verbalized understanding.

## 2019-08-05 NOTE — Telephone Encounter (Signed)
-----   Message from Kathrynn Ducking, MD sent at 08/05/2019  7:00 AM EDT -----  The blood work results are unremarkable. Please call the patient. ----- Message ----- From: Lavone Neri Lab Results In Sent: 08/05/2019   5:38 AM EDT To: Kathrynn Ducking, MD

## 2019-08-19 ENCOUNTER — Telehealth: Payer: Self-pay | Admitting: Neurology

## 2019-08-19 NOTE — Telephone Encounter (Signed)
CT of the head is unremarkable, no evidence of significant cerebrovascular disease to explain the memory issue.  I discussed this with the patient, she is on Aricept but can only tolerate one half of a 5 mg tablet of this medication, she claims that a full tablet wipes her out the next day.   CT head 08/18/19:  Impression:  No acute intracranial abnormality.

## 2019-10-15 ENCOUNTER — Encounter: Payer: Self-pay | Admitting: *Deleted

## 2019-10-15 ENCOUNTER — Encounter: Payer: Self-pay | Admitting: Cardiology

## 2019-10-16 ENCOUNTER — Ambulatory Visit: Payer: Medicare HMO | Admitting: Cardiology

## 2019-10-20 ENCOUNTER — Ambulatory Visit: Payer: Medicare HMO | Admitting: Cardiology

## 2019-10-20 ENCOUNTER — Encounter: Payer: Self-pay | Admitting: Cardiology

## 2019-10-20 ENCOUNTER — Other Ambulatory Visit: Payer: Self-pay

## 2019-10-20 ENCOUNTER — Ambulatory Visit (INDEPENDENT_AMBULATORY_CARE_PROVIDER_SITE_OTHER): Payer: Medicare HMO

## 2019-10-20 VITALS — BP 145/66 | HR 52 | Ht 60.0 in | Wt 120.0 lb

## 2019-10-20 DIAGNOSIS — R42 Dizziness and giddiness: Secondary | ICD-10-CM

## 2019-10-20 DIAGNOSIS — E782 Mixed hyperlipidemia: Secondary | ICD-10-CM

## 2019-10-20 DIAGNOSIS — I10 Essential (primary) hypertension: Secondary | ICD-10-CM

## 2019-10-20 DIAGNOSIS — I272 Pulmonary hypertension, unspecified: Secondary | ICD-10-CM

## 2019-10-20 DIAGNOSIS — I498 Other specified cardiac arrhythmias: Secondary | ICD-10-CM

## 2019-10-20 DIAGNOSIS — I2510 Atherosclerotic heart disease of native coronary artery without angina pectoris: Secondary | ICD-10-CM

## 2019-10-20 DIAGNOSIS — R001 Bradycardia, unspecified: Secondary | ICD-10-CM

## 2019-10-20 HISTORY — DX: Dizziness and giddiness: R42

## 2019-10-20 HISTORY — DX: Pulmonary hypertension, unspecified: I27.20

## 2019-10-20 HISTORY — DX: Bradycardia, unspecified: R00.1

## 2019-10-20 HISTORY — DX: Atherosclerotic heart disease of native coronary artery without angina pectoris: I25.10

## 2019-10-20 HISTORY — DX: Other specified cardiac arrhythmias: I49.8
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
## 2019-10-20 NOTE — Patient Instructions (Signed)
Medication Instructions:  Your physician recommends that you continue on your current medications as directed. Please refer to the Current Medication list given to you today.  *If you need a refill on your cardiac medications before your next appointment, please call your pharmacy*   Lab Work: Your physician recommends that you return for lab work in: TODAY BMP, TSH, Mag If you have labs (blood work) drawn today and your tests are completely normal, you will receive your results only by: Marland Kitchen MyChart Message (if you have MyChart) OR . A paper copy in the mail If you have any lab test that is abnormal or we need to change your treatment, we will call you to review the results.   Testing/Procedures: Your physician has requested that you have a carotid duplex. This test is an ultrasound of the carotid arteries in your neck. It looks at blood flow through these arteries that supply the brain with blood. Allow one hour for this exam. There are no restrictions or special instructions.    Follow-Up: At [facility], you and your health needs are our priority.  As part of our continuing mission to provide you with exceptional heart care, we have created designated Provider Care Teams.  These Care Teams include your primary Cardiologist (physician) and Advanced Practice Providers (APPs -  Physician Assistants and Nurse Practitioners) who all work together to provide you with the care you need, when you need it.  We recommend signing up for the patient portal called "MyChart".  Sign up information is provided on this After Visit Summary.  MyChart is used to connect with patients for Virtual Visits (Telemedicine).  Patients are able to view lab/test results, encounter notes, upcoming appointments, etc.  Non-urgent messages can be sent to your provider as well.   To learn more about what you can do with MyChart, go to [email or web address].    Your next appointment:   3 month(s)  The format for  your next appointment:   In Person  Provider:   Berniece Salines, DO   Other Instructions

## 2019-10-21 ENCOUNTER — Telehealth: Payer: Self-pay

## 2019-10-21 LAB — BASIC METABOLIC PANEL
BUN/Creatinine Ratio: 27 (ref 12–28)
BUN: 21 mg/dL (ref 8–27)
CO2: 25 mmol/L (ref 20–29)
Calcium: 9.2 mg/dL (ref 8.7–10.3)
Chloride: 107 mmol/L — ABNORMAL HIGH (ref 96–106)
Creatinine, Ser: 0.77 mg/dL (ref 0.57–1.00)
GFR calc Af Amer: 83 mL/min/{1.73_m2} (ref >59)
GFR calc non Af Amer: 72 mL/min/{1.73_m2} (ref >59)
Glucose: 90 mg/dL (ref 65–99)
Potassium: 5.1 mmol/L (ref 3.5–5.2)
Sodium: 144 mmol/L (ref 134–144)

## 2019-10-21 LAB — MAGNESIUM: Magnesium: 2.2 mg/dL (ref 1.6–2.3)

## 2019-10-21 LAB — TSH: TSH: 0.582 u[IU]/mL (ref 0.450–4.500)

## 2019-10-21 NOTE — Telephone Encounter (Signed)
-----   Message from Berniece Salines, DO sent at 10/21/2019 10:04 AM EDT ----- Normal labs. Please notify patient.

## 2019-10-21 NOTE — Telephone Encounter (Signed)
Spoke with patient regarding results and recommendation.  Patient verbalizes understanding and is agreeable to plan of care. Advised patient to call back with any issues or concerns.  

## 2019-10-21 NOTE — Telephone Encounter (Signed)
Follow Up:     Pt is calling you back from this morning.

## 2019-10-21 NOTE — Telephone Encounter (Signed)
Tried calling patient. No answer and no voicemail set up for me to leave a message. 

## 2019-11-03 ENCOUNTER — Other Ambulatory Visit: Payer: Self-pay

## 2019-11-03 ENCOUNTER — Ambulatory Visit (INDEPENDENT_AMBULATORY_CARE_PROVIDER_SITE_OTHER): Payer: Medicare HMO

## 2019-11-03 DIAGNOSIS — R42 Dizziness and giddiness: Secondary | ICD-10-CM | POA: Diagnosis not present

## 2019-11-03 NOTE — Progress Notes (Signed)
Carotid duplex exam performed  Jimmy Tian Mcmurtrey RDCS, RVT 

## 2019-11-04 ENCOUNTER — Telehealth: Payer: Self-pay

## 2019-11-04 NOTE — Telephone Encounter (Signed)
Tried calling patient. No answer and no voicemail set up for me to leave a message. 

## 2019-11-04 NOTE — Telephone Encounter (Signed)
-----   Message from Loel Dubonnet, NP sent at 11/04/2019 10:58 AM EDT ----- Bilateral carotid arteries with no significant stenosis. Good result!

## 2019-11-04 NOTE — Telephone Encounter (Signed)
Patient returning call.

## 2019-11-04 NOTE — Telephone Encounter (Signed)
Spoke with patient regarding results and recommendation.  Patient verbalizes understanding and is agreeable to plan of care. Advised patient to call back with any issues or concerns.  

## 2019-11-26 ENCOUNTER — Telehealth: Payer: Self-pay

## 2019-11-26 NOTE — Telephone Encounter (Signed)
Pt aware that Dr. Harriet Masson would like to see her in office regarding her monitor results. Appt made for 7/29 at 3 pm.

## 2019-11-26 NOTE — Telephone Encounter (Signed)
-----   Message from Berniece Salines, DO sent at 11/26/2019  9:04 AM EDT ----- I would like to see the patient within the next 2 weeks to discuss her monitor results.

## 2019-12-04 ENCOUNTER — Ambulatory Visit: Payer: Medicare HMO | Admitting: Cardiology

## 2019-12-08 ENCOUNTER — Encounter: Payer: Self-pay | Admitting: Cardiology

## 2019-12-08 ENCOUNTER — Other Ambulatory Visit: Payer: Self-pay

## 2019-12-08 ENCOUNTER — Ambulatory Visit: Payer: Medicare HMO | Admitting: Cardiology

## 2019-12-08 VITALS — BP 144/62 | HR 56 | Ht 62.0 in | Wt 119.6 lb

## 2019-12-08 DIAGNOSIS — I491 Atrial premature depolarization: Secondary | ICD-10-CM

## 2019-12-08 DIAGNOSIS — I471 Supraventricular tachycardia: Secondary | ICD-10-CM | POA: Diagnosis not present

## 2019-12-08 DIAGNOSIS — I10 Essential (primary) hypertension: Secondary | ICD-10-CM

## 2019-12-08 DIAGNOSIS — I2510 Atherosclerotic heart disease of native coronary artery without angina pectoris: Secondary | ICD-10-CM

## 2019-12-08 DIAGNOSIS — R7303 Prediabetes: Secondary | ICD-10-CM

## 2019-12-08 DIAGNOSIS — I272 Pulmonary hypertension, unspecified: Secondary | ICD-10-CM

## 2019-12-08 MED ORDER — ACEBUTOLOL HCL 200 MG PO CAPS: 200.0000 mg | ORAL_CAPSULE | Freq: Two times a day (BID) | ORAL | 3 refills | Status: DC
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
## 2019-12-08 NOTE — Patient Instructions (Signed)
Medication Instructions:  Start Acebutolol 200 mg twice a day   *If you need a refill on your cardiac medications before your next appointment, please call your pharmacy*   Lab Work: None ordered   If you have labs (blood work) drawn today and your tests are completely normal, you will receive your results only by: Marland Kitchen MyChart Message (if you have MyChart) OR . A paper copy in the mail If you have any lab test that is abnormal or we need to change your treatment, we will call you to review the results.   Testing/Procedures: None ordered    Follow-Up: At [facility], you and your health needs are our priority.  As part of our continuing mission to provide you with exceptional heart care, we have created designated Provider Care Teams.  These Care Teams include your primary Cardiologist (physician) and Advanced Practice Providers (APPs -  Physician Assistants and Nurse Practitioners) who all work together to provide you with the care you need, when you need it.  We recommend signing up for the patient portal called "MyChart".  Sign up information is provided on this After Visit Summary.  MyChart is used to connect with patients for Virtual Visits (Telemedicine).  Patients are able to view lab/test results, encounter notes, upcoming appointments, etc.  Non-urgent messages can be sent to your provider as well.   To learn more about what you can do with MyChart, go to [email or web address].    Your next appointment:   1 month(s)  The format for your next appointment:   In Person  Provider:   Berniece Salines, DO   Other Instructions None

## 2019-12-30 ENCOUNTER — Other Ambulatory Visit: Payer: Self-pay

## 2019-12-30 MED ORDER — LISINOPRIL 10 MG PO TABS
10.0000 mg | ORAL_TABLET | Freq: Every day | ORAL | 3 refills | Status: DC
Start: 2019-12-30 — End: 2020-08-27

## 2019-12-30 MED ORDER — ACEBUTOLOL HCL 200 MG PO CAPS: 200.0000 mg | ORAL_CAPSULE | Freq: Two times a day (BID) | ORAL | 3 refills | Status: DC

## 2019-12-30 MED ORDER — ATORVASTATIN CALCIUM 40 MG PO TABS: 40.0000 mg | ORAL_TABLET | Freq: Every day | ORAL | 3 refills | Status: DC

## 2019-12-30 NOTE — Telephone Encounter (Signed)
Refill sent to [facility] for lisinopril, atorvastatin, and acebutolol.

## 2020-01-01 ENCOUNTER — Telehealth: Payer: Self-pay | Admitting: *Deleted

## 2020-01-01 NOTE — Telephone Encounter (Signed)
Spoke with pt and let her know I called [employer] about the bill she received for monitor. Apparently they did not have her insurance info when first sent bill but now they have her [employer] and have billed her insurance and her balance should be $25.00. Pt stated she could handle that! Let her know to call us if any more concerns arise.

## 2020-01-23 DIAGNOSIS — R0602 Shortness of breath: Secondary | ICD-10-CM | POA: Insufficient documentation

## 2020-01-23 DIAGNOSIS — E785 Hyperlipidemia, unspecified: Secondary | ICD-10-CM | POA: Insufficient documentation

## 2020-01-23 DIAGNOSIS — M1990 Unspecified osteoarthritis, unspecified site: Secondary | ICD-10-CM | POA: Insufficient documentation

## 2020-01-23 DIAGNOSIS — Z8711 Personal history of peptic ulcer disease: Secondary | ICD-10-CM | POA: Insufficient documentation

## 2020-01-23 DIAGNOSIS — K279 Peptic ulcer, site unspecified, unspecified as acute or chronic, without hemorrhage or perforation: Secondary | ICD-10-CM | POA: Insufficient documentation

## 2020-01-23 DIAGNOSIS — Z87440 Personal history of urinary (tract) infections: Secondary | ICD-10-CM | POA: Insufficient documentation

## 2020-01-26 ENCOUNTER — Ambulatory Visit: Payer: Medicare HMO | Admitting: Cardiology

## 2020-01-26 ENCOUNTER — Encounter: Payer: Self-pay | Admitting: Cardiology

## 2020-01-26 ENCOUNTER — Other Ambulatory Visit: Payer: Self-pay

## 2020-01-26 VITALS — BP 138/78 | HR 46 | Ht 62.0 in | Wt 116.0 lb

## 2020-01-26 DIAGNOSIS — R072 Precordial pain: Secondary | ICD-10-CM | POA: Diagnosis not present

## 2020-01-26 DIAGNOSIS — I471 Supraventricular tachycardia: Secondary | ICD-10-CM

## 2020-01-26 DIAGNOSIS — R001 Bradycardia, unspecified: Secondary | ICD-10-CM

## 2020-01-26 DIAGNOSIS — I2510 Atherosclerotic heart disease of native coronary artery without angina pectoris: Secondary | ICD-10-CM

## 2020-01-26 DIAGNOSIS — I272 Pulmonary hypertension, unspecified: Secondary | ICD-10-CM

## 2020-01-26 DIAGNOSIS — I491 Atrial premature depolarization: Secondary | ICD-10-CM

## 2020-01-26 MED ORDER — ACEBUTOLOL HCL 200 MG PO CAPS
200.0000 mg | ORAL_CAPSULE | Freq: Every day | ORAL | 3 refills | Status: DC
Start: 2020-01-26 — End: 2020-08-27

## 2020-01-26 MED ORDER — NITROGLYCERIN 0.4 MG SL SUBL: 0.4000 mg | SUBLINGUAL_TABLET | SUBLINGUAL | 6 refills | Status: DC | PRN

## 2020-01-26 NOTE — Progress Notes (Signed)
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 schedule, including diet, activities, and medicines. ? Whether you should drink more fluids. This will help to remove the tracer from your body. Drink enough fluid to keep your pee (urine) pale yellow.  Ask your doctor, or the department that is doing the test: ? When will my results be ready? ? How will I get my results? Summary  A cardiac nuclear scan is a test that is done to check the flow of blood to your heart.  Tell your doctor whether you are pregnant or may be pregnant.  Before the test, ask your doctor about changing or stopping your normal medicines. This is important.  Ask your doctor whether you can return to your normal activities. You may be asked to drink more fluids. This  information is not intended to replace advice given to you by your health care provider. Make sure you discuss any questions you have with your health care provider. Document Revised: 08/14/2018 Document Reviewed: 10/08/2017 Elsevier Patient Education  [place].      Adopting a Healthy Lifestyle.  Know what a healthy weight is for you (roughly BMI <25) and aim to maintain this   Aim for 7+ servings of fruits and vegetables daily   65-80+ fluid ounces of water or unsweet tea for healthy kidneys   Limit to max 1 drink of alcohol per day; avoid smoking/tobacco   Limit animal fats in diet for cholesterol and heart health - choose grass fed whenever available   Avoid highly processed foods, and foods high in saturated/trans fats   Aim for low stress - take time to unwind and care for your mental health   Aim for 150 min of moderate intensity exercise weekly for heart health, and weights twice weekly for bone health   Aim for 7-9 hours of sleep daily   When it comes to diets, agreement about the perfect plan isnt easy to find, even among the experts. Experts at the [place] developed an idea known as the Healthy Eating Plate. Just imagine a plate divided into logical, healthy portions.   The emphasis is on diet quality:   Load up on vegetables and fruits - one-half of your plate: Aim for color and variety, and remember that potatoes dont count.   Go for whole grains - one-quarter of your plate: Whole wheat, barley, wheat berries, quinoa, oats, brown rice, and foods made with them. If you want pasta, go with whole wheat pasta.   Protein power - one-quarter of your plate: Fish, chicken, beans, and nuts are all healthy, versatile protein sources. Limit red meat.   The diet, however, does go beyond the plate, offering a few other suggestions.   Use healthy plant oils, such as olive, canola, soy, corn, sunflower and peanut. Check the labels, and avoid  partially hydrogenated oil, which have unhealthy  trans fats.   If youre thirsty, drink water. Coffee and tea are good in moderation, but skip sugary drinks and limit milk and dairy products to one or two daily servings.   The type of carbohydrate in the diet is more important than the amount. Some sources of carbohydrates, such as vegetables, fruits, whole grains, and beans-are healthier than others.   Finally, stay active  Signed, Berniece Salines, DO  01/26/2020 2:16 PM    [facility]

## 2020-01-26 NOTE — Patient Instructions (Addendum)
Medication Instructions:  Your physician has recommended you make the following change in your medication:   Decrease your Acetabutol to once daily. Take nitroglycerin as needed for chest pain.  *If you need a refill on your cardiac medications before your next appointment, please call your pharmacy*   Lab Work: None ordered If you have labs (blood work) drawn today and your tests are completely normal, you will receive your results only by: Marland Kitchen MyChart Message (if you have MyChart) OR . A paper copy in the mail If you have any lab test that is abnormal or we need to change your treatment, we will call you to review the results.   Testing/Procedures: Your physician has requested that you have a lexiscan myoview. For further information please visit [email or web address]. Please follow instruction sheet, as given.  The test will take approximately 3 to 4 hours to complete; you may bring reading material.  If someone comes with you to your appointment, they will need to remain in the main lobby due to limited space in the testing area.   How to prepare for your Myocardial Perfusion Test: . Do not eat or drink 3 hours prior to your test, except you may have water. . Do not consume products containing caffeine (regular or decaffeinated) 12 hours prior to your test. (ex: coffee, chocolate, sodas, tea). . Do bring a list of your current medications with you.  If not listed below, you may take your medications as normal. . Do wear comfortable clothes (no dresses or overalls) and walking shoes, tennis shoes preferred (No heels or open toe shoes are allowed). . Do NOT wear cologne, perfume, aftershave, or lotions (deodorant is allowed). . If these instructions are not followed, your test will have to be rescheduled.   Follow-Up: At [facility], you and your health needs are our priority.  As part of our continuing mission to provide you with exceptional heart care, we have created designated  Provider Care Teams.  These Care Teams include your primary Cardiologist (physician) and Advanced Practice Providers (APPs -  Physician Assistants and Nurse Practitioners) who all work together to provide you with the care you need, when you need it.  We recommend signing up for the patient portal called "MyChart".  Sign up information is provided on this After Visit Summary.  MyChart is used to connect with patients for Virtual Visits (Telemedicine).  Patients are able to view lab/test results, encounter notes, upcoming appointments, etc.  Non-urgent messages can be sent to your provider as well.   To learn more about what you can do with MyChart, go to [email or web address].    Your next appointment:   3 month(s)  The format for your next appointment:   In Person  Provider:   Berniece Salines, DO   Other Instructions Nitroglycerin sublingual tablets What is this medicine? NITROGLYCERIN (nye troe GLI ser in) is a type of vasodilator. It relaxes blood vessels, increasing the blood and oxygen supply to your heart. This medicine is used to relieve chest pain caused by angina. It is also used to prevent chest pain before activities like climbing stairs, going outdoors in cold weather, or sexual activity. This medicine may be used for other purposes; ask your health care provider or pharmacist if you have questions. COMMON BRAND NAME(S): Nitroquick, Nitrostat, Nitrotab What should I tell my health care provider before I take this medicine? They need to know if you have any of these conditions:  anemia  head injury, recent stroke, or  bleeding in the brain  liver disease  previous heart attack  an unusual or allergic reaction to nitroglycerin, other medicines, foods, dyes, or preservatives  pregnant or trying to get pregnant  breast-feeding How should I use this medicine? Take this medicine by mouth as needed. At the first sign of an angina attack (chest pain or tightness) place one  tablet under your tongue. You can also take this medicine 5 to 10 minutes before an event likely to produce chest pain. Follow the directions on the prescription label. Let the tablet dissolve under the tongue. Do not swallow whole. Replace the dose if you accidentally swallow it. It will help if your mouth is not dry. Saliva around the tablet will help it to dissolve more quickly. Do not eat or drink, smoke or chew tobacco while a tablet is dissolving. If you are not better within 5 minutes after taking ONE dose of nitroglycerin, call 9-1-1 immediately to seek emergency medical care. Do not take more than 3 nitroglycerin tablets over 15 minutes. If you take this medicine often to relieve symptoms of angina, your doctor or health care professional may provide you with different instructions to manage your symptoms. If symptoms do not go away after following these instructions, it is important to call 9-1-1 immediately. Do not take more than 3 nitroglycerin tablets over 15 minutes. Talk to your pediatrician regarding the use of this medicine in children. Special care may be needed. Overdosage: If you think you have taken too much of this medicine contact a poison control center or emergency room at once. NOTE: This medicine is only for you. Do not share this medicine with others. What if I miss a dose? This does not apply. This medicine is only used as needed. What may interact with this medicine? Do not take this medicine with any of the following medications:  certain migraine medicines like ergotamine and dihydroergotamine (DHE)  medicines used to treat erectile dysfunction like sildenafil, tadalafil, and vardenafil  riociguat This medicine may also interact with the following medications:  alteplase  aspirin  heparin  medicines for high blood pressure  medicines for mental depression  other medicines used to treat angina  phenothiazines like chlorpromazine, mesoridazine,  prochlorperazine, thioridazine This list may not describe all possible interactions. Give your health care provider a list of all the medicines, herbs, non-prescription drugs, or dietary supplements you use. Also tell them if you smoke, drink alcohol, or use illegal drugs. Some items may interact with your medicine. What should I watch for while using this medicine? Tell your doctor or health care professional if you feel your medicine is no longer working. Keep this medicine with you at all times. Sit or lie down when you take your medicine to prevent falling if you feel dizzy or faint after using it. Try to remain calm. This will help you to feel better faster. If you feel dizzy, take several deep breaths and lie down with your feet propped up, or bend forward with your head resting between your knees. You may get drowsy or dizzy. Do not drive, use machinery, or do anything that needs mental alertness until you know how this drug affects you. Do not stand or sit up quickly, especially if you are an older patient. This reduces the risk of dizzy or fainting spells. Alcohol can make you more drowsy and dizzy. Avoid alcoholic drinks. Do not treat yourself for coughs, colds, or pain while you are taking this medicine without asking your doctor  or health care professional for advice. Some ingredients may increase your blood pressure. What side effects may I notice from receiving this medicine? Side effects that you should report to your doctor or health care professional as soon as possible:  blurred vision  dry mouth  skin rash  sweating  the feeling of extreme pressure in the head  unusually weak or tired Side effects that usually do not require medical attention (report to your doctor or health care professional if they continue or are bothersome):  flushing of the face or neck  headache  irregular heartbeat, palpitations  nausea, vomiting This list may not describe all possible side  effects. Call your doctor for medical advice about side effects. You may report side effects to FDA at 1-800-FDA-1088. Where should I keep my medicine? Keep out of the reach of children. Store at room temperature between 20 and 25 degrees C (68 and 77 degrees F). Store in [facility] [employer]. Protect from light and moisture. Keep tightly closed. Throw away any unused medicine after the expiration date. NOTE: This sheet is a summary. It may not cover all possible information. If you have questions about this medicine, talk to your doctor, pharmacist, or health care provider.  2020 Elsevier/Gold Standard (2013-02-20 17:57:36)  Cardiac Nuclear Scan A cardiac nuclear scan is a test that is done to check the flow of blood to your heart. It is done when you are resting and when you are exercising. The test looks for problems such as:  Not enough blood reaching a portion of the heart.  The heart muscle not working as it should. You may need this test if:  You have heart disease.  You have had lab results that are not normal.  You have had heart surgery or a balloon procedure to open up blocked arteries (angioplasty).  You have chest pain.  You have shortness of breath. In this test, a special dye (tracer) is put into your bloodstream. The tracer will travel to your heart. A camera will then take pictures of your heart to see how the tracer moves through your heart. This test is usually done at a hospital and takes 2-4 hours. Tell a doctor about:  Any allergies you have.  All medicines you are taking, including vitamins, herbs, eye drops, creams, and over-the-counter medicines.  Any problems you or family members have had with anesthetic medicines.  Any blood disorders you have.  Any surgeries you have had.  Any medical conditions you have.  Whether you are pregnant or may be pregnant. What are the risks? Generally, this is a safe test. However, problems may occur, such  as:  Serious chest pain and heart attack. This is only a risk if the stress portion of the test is done.  Rapid heartbeat.  A feeling of warmth in your chest. This feeling usually does not last long.  Allergic reaction to the tracer. What happens before the test?  Ask your doctor about changing or stopping your normal medicines. This is important.  Follow instructions from your doctor about what you cannot eat or drink.  Remove your jewelry on the day of the test. What happens during the test?  An IV tube will be inserted into one of your veins.  Your doctor will give you a small amount of tracer through the IV tube.  You will wait for 20-40 minutes while the tracer moves through your bloodstream.  Your heart will be monitored with an electrocardiogram (ECG).  You will  lie down on an exam table.  Pictures of your heart will be taken for about 15-20 minutes.  You may also have a stress test. For this test, one of these things may be done: ? You will be asked to exercise on a treadmill or a stationary bike. ? You will be given medicines that will make your heart work harder. This is done if you are unable to exercise.  When blood flow to your heart has peaked, a tracer will again be given through the IV tube.  After 20-40 minutes, you will get back on the exam table. More pictures will be taken of your heart.  Depending on the tracer that is used, more pictures may need to be taken 3-4 hours later.  Your IV tube will be removed when the test is over. The test may vary among doctors and hospitals. What happens after the test?  Ask your doctor: ? Whether you can return to your normal schedule, including diet, activities, and medicines. ? Whether you should drink more fluids. This will help to remove the tracer from your body. Drink enough fluid to keep your pee (urine) pale yellow.  Ask your doctor, or the department that is doing the test: ? When will my results be  ready? ? How will I get my results? Summary  A cardiac nuclear scan is a test that is done to check the flow of blood to your heart.  Tell your doctor whether you are pregnant or may be pregnant.  Before the test, ask your doctor about changing or stopping your normal medicines. This is important.  Ask your doctor whether you can return to your normal activities. You may be asked to drink more fluids. This information is not intended to replace advice given to you by your health care provider. Make sure you discuss any questions you have with your health care provider. Document Revised: 08/14/2018 Document Reviewed: 10/08/2017 Elsevier Patient Education  [place].

## 2020-01-28 ENCOUNTER — Telehealth (HOSPITAL_COMMUNITY): Payer: Self-pay | Admitting: *Deleted

## 2020-01-28 NOTE — Telephone Encounter (Signed)
Patient given detailed instructions per Myocardial Perfusion Study Information Sheet for the test on  02/04/20. Patient notified to arrive 15 minutes early and that it is imperative to arrive on time for appointment to keep from having the test rescheduled.  If you need to cancel or reschedule your appointment, please call the office within 24 hours of your appointment. . Patient verbalized understanding. Wanda Murillo

## 2020-02-04 ENCOUNTER — Other Ambulatory Visit: Payer: Self-pay

## 2020-02-04 ENCOUNTER — Ambulatory Visit (INDEPENDENT_AMBULATORY_CARE_PROVIDER_SITE_OTHER): Payer: Medicare HMO

## 2020-02-04 VITALS — Ht 62.0 in | Wt 116.0 lb

## 2020-02-04 DIAGNOSIS — I2510 Atherosclerotic heart disease of native coronary artery without angina pectoris: Secondary | ICD-10-CM

## 2020-02-04 DIAGNOSIS — R110 Nausea: Secondary | ICD-10-CM

## 2020-02-04 DIAGNOSIS — R072 Precordial pain: Secondary | ICD-10-CM | POA: Diagnosis not present

## 2020-02-04 LAB — MYOCARDIAL PERFUSION IMAGING
LV dias vol: 77 mL (ref 46–106)
LV sys vol: 27 mL
Peak HR: 81 {beats}/min
Rest HR: 46 {beats}/min
SDS: 0
SRS: 2
SSS: 2
TID: 0.88

## 2020-02-04 MED ORDER — AMINOPHYLLINE 25 MG/ML IV SOLN
75.0000 mg | Freq: Once | INTRAVENOUS | Status: AC
  Administered 2020-02-04: 75 mg via INTRAVENOUS

## 2020-02-04 MED ORDER — REGADENOSON 0.4 MG/5ML IV SOLN
0.4000 mg | Freq: Once | INTRAVENOUS | Status: AC
  Administered 2020-02-04: 0.4 mg via INTRAVENOUS

## 2020-02-04 MED ORDER — TECHNETIUM TC 99M TETROFOSMIN IV KIT
31.6000 | PACK | Freq: Once | INTRAVENOUS | Status: AC | PRN
  Administered 2020-02-04: 31.6 via INTRAVENOUS

## 2020-02-04 MED ORDER — TECHNETIUM TC 99M TETROFOSMIN IV KIT
9.8000 | PACK | Freq: Once | INTRAVENOUS | Status: AC | PRN
  Administered 2020-02-04: 9.8 via INTRAVENOUS

## 2020-02-05 ENCOUNTER — Telehealth: Payer: Self-pay

## 2020-02-05 ENCOUNTER — Encounter: Payer: Self-pay | Admitting: Neurology

## 2020-02-05 ENCOUNTER — Ambulatory Visit (INDEPENDENT_AMBULATORY_CARE_PROVIDER_SITE_OTHER): Payer: Medicare HMO | Admitting: Neurology

## 2020-02-05 VITALS — BP 114/70 | HR 45 | Ht 62.0 in | Wt 116.2 lb

## 2020-02-05 DIAGNOSIS — R413 Other amnesia: Secondary | ICD-10-CM | POA: Diagnosis not present

## 2020-02-05 MED ORDER — MEMANTINE HCL 10 MG PO TABS: ORAL_TABLET | ORAL | 11 refills | Status: DC

## 2020-02-05 NOTE — Telephone Encounter (Signed)
Patient is returning call.  °

## 2020-02-05 NOTE — Telephone Encounter (Signed)
Left message on patients voicemail to please return our call.   

## 2020-02-05 NOTE — Telephone Encounter (Signed)
-----   Message from Berniece Salines, DO sent at 02/04/2020  6:10 PM EDT ----- Doristine Devoid news, stress test normal
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
## 2020-02-05 NOTE — Patient Instructions (Addendum)
Memory looked good 28/30 score today Start Namenda 10 mg twice daily, start taking 1/2 tablet twice daily x 2 weeks, then take 1 tablet twice daily, stay off the Aricept  See you back in 6 months   Memantine Tablets What is this medicine? MEMANTINE (MEM an teen) is used to treat dementia caused by Alzheimer's disease. This medicine may be used for other purposes; ask your health care provider or pharmacist if you have questions. COMMON BRAND NAME(S): Namenda What should I tell my health care provider before I take this medicine? They need to know if you have any of these conditions:  difficulty passing urine  kidney disease  liver disease  seizures  an unusual or allergic reaction to memantine, other medicines, foods, dyes, or preservatives  pregnant or trying to get pregnant  breast-feeding How should I use this medicine? Take this medicine by mouth with a glass of water. Follow the directions on the prescription label. You may take this medicine with or without food. Take your doses at regular intervals. Do not take your medicine more often than directed. Continue to take your medicine even if you feel better. Do not stop taking except on the advice of your doctor or health care professional. Talk to your pediatrician regarding the use of this medicine in children. Special care may be needed. Overdosage: If you think you have taken too much of this medicine contact a poison control center or emergency room at once. NOTE: This medicine is only for you. Do not share this medicine with others. What if I miss a dose? If you miss a dose, take it as soon as you can. If it is almost time for your next dose, take only that dose. Do not take double or extra doses. If you do not take your medicine for several days, contact your health care provider. Your dose may need to be changed. What may interact with this  medicine?  acetazolamide  amantadine  cimetidine  dextromethorphan  dofetilide  hydrochlorothiazide  ketamine  metformin  methazolamide  quinidine  ranitidine  sodium bicarbonate  triamterene This list may not describe all possible interactions. Give your health care provider a list of all the medicines, herbs, non-prescription drugs, or dietary supplements you use. Also tell them if you smoke, drink alcohol, or use illegal drugs. Some items may interact with your medicine. What should I watch for while using this medicine? Visit your doctor or health care professional for regular checks on your progress. Check with your doctor or health care professional if there is no improvement in your symptoms or if they get worse. You may get drowsy or dizzy. Do not drive, use machinery, or do anything that needs mental alertness until you know how this drug affects you. Do not stand or sit up quickly, especially if you are an older patient. This reduces the risk of dizzy or fainting spells. Alcohol can make you more drowsy and dizzy. Avoid alcoholic drinks. What side effects may I notice from receiving this medicine? Side effects that you should report to your doctor or health care professional as soon as possible:  allergic reactions like skin rash, itching or hives, swelling of the face, lips, or tongue  agitation or a feeling of restlessness  depressed mood  dizziness  hallucinations  redness, blistering, peeling or loosening of the skin, including inside the mouth  seizures  vomiting Side effects that usually do not require medical attention (report to your doctor or health care professional  if they continue or are bothersome):  constipation  diarrhea  headache  nausea  trouble sleeping This list may not describe all possible side effects. Call your doctor for medical advice about side effects. You may report side effects to FDA at 1-800-FDA-1088. Where should I  keep my medicine? Keep out of the reach of children. Store at room temperature between 15 degrees and 30 degrees C (59 degrees and 86 degrees F). Throw away any unused medicine after the expiration date. NOTE: This sheet is a summary. It may not cover all possible information. If you have questions about this medicine, talk to your doctor, pharmacist, or health care provider.  2020 Elsevier/Gold Standard (2013-02-10 14:10:42)

## 2020-02-05 NOTE — Progress Notes (Signed)
I have read the note, and I agree with the clinical assessment and plan.  Wilbern Pennypacker K Cayne Yom   

## 2020-02-06 ENCOUNTER — Telehealth: Payer: Self-pay

## 2020-02-06 NOTE — Telephone Encounter (Signed)
-----   Message from Berniece Salines, DO sent at 02/04/2020  6:10 PM EDT ----- Doristine Devoid news, stress test normal

## 2020-02-06 NOTE — Telephone Encounter (Signed)
Spoke with patient regarding results and recommendation.  Patient verbalizes understanding and is agreeable to plan of care. Advised patient to call back with any issues or concerns.  

## 2020-02-29 ENCOUNTER — Other Ambulatory Visit: Payer: Self-pay | Admitting: Neurology

## 2020-03-24 DIAGNOSIS — C50919 Malignant neoplasm of unspecified site of unspecified female breast: Secondary | ICD-10-CM | POA: Insufficient documentation

## 2020-04-19 ENCOUNTER — Other Ambulatory Visit: Payer: Self-pay

## 2020-04-19 ENCOUNTER — Ambulatory Visit: Payer: Medicare HMO | Admitting: Cardiology

## 2020-04-19 ENCOUNTER — Encounter: Payer: Self-pay | Admitting: Cardiology

## 2020-04-19 VITALS — BP 118/60 | HR 60 | Ht 61.0 in | Wt 122.4 lb

## 2020-04-19 DIAGNOSIS — I272 Pulmonary hypertension, unspecified: Secondary | ICD-10-CM | POA: Diagnosis not present

## 2020-04-19 DIAGNOSIS — I10 Essential (primary) hypertension: Secondary | ICD-10-CM

## 2020-04-19 DIAGNOSIS — I2510 Atherosclerotic heart disease of native coronary artery without angina pectoris: Secondary | ICD-10-CM | POA: Diagnosis not present
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
## 2020-04-19 NOTE — Patient Instructions (Signed)
Medication Instructions:  °No medication changes. °*If you need a refill on your cardiac medications before your next appointment, please call your pharmacy* ° ° °Lab Work: °None ordered °If you have labs (blood work) drawn today and your tests are completely normal, you will receive your results only by: °• MyChart Message (if you have MyChart) OR °• A paper copy in the mail °If you have any lab test that is abnormal or we need to change your treatment, we will call you to review the results. ° ° °Testing/Procedures: °None ordered ° ° °Follow-Up: °At [facility], you and your health needs are our priority.  As part of our continuing mission to provide you with exceptional heart care, we have created designated Provider Care Teams.  These Care Teams include your primary Cardiologist (physician) and Advanced Practice Providers (APPs -  Physician Assistants and Nurse Practitioners) who all work together to provide you with the care you need, when you need it. ° °We recommend signing up for the patient portal called "MyChart".  Sign up information is provided on this After Visit Summary.  MyChart is used to connect with patients for Virtual Visits (Telemedicine).  Patients are able to view lab/test results, encounter notes, upcoming appointments, etc.  Non-urgent messages can be sent to your provider as well.   °To learn more about what you can do with MyChart, go to [email or web address].   ° °Your next appointment:   °12 month(s) ° °The format for your next appointment:   °In Person ° °Provider:   °Kardie Tobb, DO ° ° °Other Instructions °NA ° °
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
## 2020-08-10 ENCOUNTER — Ambulatory Visit: Payer: Medicare HMO | Admitting: Neurology

## 2020-08-27 ENCOUNTER — Other Ambulatory Visit: Payer: Self-pay

## 2020-08-27 MED ORDER — ATORVASTATIN CALCIUM 40 MG PO TABS: 40.0000 mg | ORAL_TABLET | Freq: Every day | ORAL | 1 refills | Status: DC

## 2020-08-27 MED ORDER — NITROGLYCERIN 0.4 MG SL SUBL: 0.4000 mg | SUBLINGUAL_TABLET | SUBLINGUAL | 7 refills | Status: DC | PRN

## 2020-08-27 MED ORDER — ACEBUTOLOL HCL 200 MG PO CAPS: 200.0000 mg | ORAL_CAPSULE | Freq: Every day | ORAL | 1 refills | Status: DC

## 2020-08-27 MED ORDER — LISINOPRIL 10 MG PO TABS: 10.0000 mg | ORAL_TABLET | Freq: Every day | ORAL | 1 refills | Status: DC

## 2020-08-27 NOTE — Telephone Encounter (Signed)
Refill of Nitroglycerin sent to [facility].

## 2020-08-29 ENCOUNTER — Other Ambulatory Visit: Payer: Self-pay | Admitting: Cardiology

## 2020-08-30 ENCOUNTER — Telehealth: Payer: Self-pay | Admitting: Cardiology

## 2020-08-30 NOTE — Telephone Encounter (Signed)
Pt's daughter states that she has an appt on 4/27 with Dr. Harriet Masson. No new sx.

## 2020-08-30 NOTE — Telephone Encounter (Signed)
STAT if patient feels like he/she is going to faint   1) Are you dizzy now? yes  2) Do you feel faint or have you passed out? yes  3) Do you have any other symptoms? Nausea, no appetite   4) Have you checked your HR and BP (record if available)? No  When patient tries to walk she walks to the side, woiuld have to catch herself on something to gain balance

## 2020-09-01 ENCOUNTER — Ambulatory Visit: Payer: Medicare Other | Admitting: Cardiology

## 2020-09-01 ENCOUNTER — Encounter: Payer: Self-pay | Admitting: Cardiology

## 2020-09-01 ENCOUNTER — Other Ambulatory Visit: Payer: Self-pay

## 2020-09-01 VITALS — BP 122/74 | HR 60 | Ht 61.0 in | Wt 117.8 lb

## 2020-09-01 DIAGNOSIS — I10 Essential (primary) hypertension: Secondary | ICD-10-CM | POA: Diagnosis not present

## 2020-09-01 DIAGNOSIS — R42 Dizziness and giddiness: Secondary | ICD-10-CM

## 2020-09-01 DIAGNOSIS — I2510 Atherosclerotic heart disease of native coronary artery without angina pectoris: Secondary | ICD-10-CM

## 2020-09-01 DIAGNOSIS — R7303 Prediabetes: Secondary | ICD-10-CM | POA: Diagnosis not present

## 2020-09-01 DIAGNOSIS — E785 Hyperlipidemia, unspecified: Secondary | ICD-10-CM

## 2020-09-01 MED ORDER — ACEBUTOLOL HCL 200 MG PO CAPS: 200.0000 mg | ORAL_CAPSULE | Freq: Every day | ORAL | 3 refills | Status: DC
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
## 2020-09-01 NOTE — Patient Instructions (Addendum)
Medication Instructions:  Your physician recommends that you continue on your current medications as directed. Please refer to the Current Medication list given to you today. Refill for Acebutolol sent to phramacy *If you need a refill on your cardiac medications before your next appointment, please call your pharmacy*   Lab Work: Your physician recommends that you return for lab work: TODAY: BMET, Mag, CBC If you have labs (blood work) drawn today and your tests are completely normal, you will receive your results only by: Marland Kitchen MyChart Message (if you have MyChart) OR . A paper copy in the mail If you have any lab test that is abnormal or we need to change your treatment, we will call you to review the results.   Testing/Procedures: None   Follow-Up: At [facility], you and your health needs are our priority.  As part of our continuing mission to provide you with exceptional heart care, we have created designated Provider Care Teams.  These Care Teams include your primary Cardiologist (physician) and Advanced Practice Providers (APPs -  Physician Assistants and Nurse Practitioners) who all work together to provide you with the care you need, when you need it.  We recommend signing up for the patient portal called "MyChart".  Sign up information is provided on this After Visit Summary.  MyChart is used to connect with patients for Virtual Visits (Telemedicine).  Patients are able to view lab/test results, encounter notes, upcoming appointments, etc.  Non-urgent messages can be sent to your provider as well.   To learn more about what you can do with MyChart, go to [email or web address].    Your next appointment:   1 year(s)  The format for your next appointment:   In Person  Provider:   Berniece Salines, DO   Other Instructions

## 2020-09-02 LAB — CBC WITH DIFFERENTIAL/PLATELET
Basophils Absolute: 0 10*3/uL (ref 0.0–0.2)
Basos: 1 %
EOS (ABSOLUTE): 0.1 10*3/uL (ref 0.0–0.4)
Eos: 2 %
Hematocrit: 37.1 % (ref 34.0–46.6)
Hemoglobin: 11.6 g/dL (ref 11.1–15.9)
Immature Grans (Abs): 0 10*3/uL (ref 0.0–0.1)
Immature Granulocytes: 0 %
Lymphocytes Absolute: 1.1 10*3/uL (ref 0.7–3.1)
Lymphs: 21 %
MCH: 28.3 pg (ref 26.6–33.0)
MCHC: 31.3 g/dL — ABNORMAL LOW (ref 31.5–35.7)
MCV: 91 fL (ref 79–97)
Monocytes Absolute: 0.3 10*3/uL (ref 0.1–0.9)
Monocytes: 6 %
Neutrophils Absolute: 3.6 10*3/uL (ref 1.4–7.0)
Neutrophils: 70 %
Platelets: 211 10*3/uL (ref 150–450)
RBC: 4.1 x10E6/uL (ref 3.77–5.28)
RDW: 13.2 % (ref 11.7–15.4)
WBC: 5.1 10*3/uL (ref 3.4–10.8)

## 2020-09-02 LAB — BASIC METABOLIC PANEL
BUN/Creatinine Ratio: 35 — ABNORMAL HIGH (ref 12–28)
BUN: 30 mg/dL — ABNORMAL HIGH (ref 8–27)
CO2: 24 mmol/L (ref 20–29)
Calcium: 9.5 mg/dL (ref 8.7–10.3)
Chloride: 106 mmol/L (ref 96–106)
Creatinine, Ser: 0.85 mg/dL (ref 0.57–1.00)
Glucose: 93 mg/dL (ref 65–99)
Potassium: 5.1 mmol/L (ref 3.5–5.2)
Sodium: 145 mmol/L — ABNORMAL HIGH (ref 134–144)
eGFR: 68 mL/min/{1.73_m2} (ref >59)

## 2020-09-02 LAB — MAGNESIUM: Magnesium: 2 mg/dL (ref 1.6–2.3)

## 2020-09-09 ENCOUNTER — Other Ambulatory Visit: Payer: Self-pay

## 2020-09-09 ENCOUNTER — Other Ambulatory Visit: Payer: Self-pay | Admitting: *Deleted

## 2020-09-09 MED ORDER — MEMANTINE HCL 10 MG PO TABS: ORAL_TABLET | ORAL | 1 refills | Status: DC

## 2020-09-09 MED ORDER — ACEBUTOLOL HCL 200 MG PO CAPS: 200.0000 mg | ORAL_CAPSULE | Freq: Every day | ORAL | 2 refills | Status: DC

## 2020-09-09 NOTE — Telephone Encounter (Signed)
Refill of Acebutolol 200 mg sent to [facility].

## 2020-12-06 ENCOUNTER — Ambulatory Visit (INDEPENDENT_AMBULATORY_CARE_PROVIDER_SITE_OTHER): Payer: Medicare Other | Admitting: Otolaryngology

## 2021-03-22 ENCOUNTER — Other Ambulatory Visit: Payer: Self-pay

## 2021-03-22 MED ORDER — NITROGLYCERIN 0.4 MG SL SUBL: 0.4000 mg | SUBLINGUAL_TABLET | SUBLINGUAL | 7 refills | Status: DC | PRN

## 2021-03-22 NOTE — Telephone Encounter (Signed)
Refill sent to pharmacy.   

## 2021-04-27 ENCOUNTER — Telehealth: Payer: Self-pay | Admitting: Neurology

## 2021-04-27 MED ORDER — MEMANTINE HCL 10 MG PO TABS: ORAL_TABLET | ORAL | 0 refills | Status: DC

## 2021-04-27 NOTE — Telephone Encounter (Signed)
Pt has been scheduled for "routine f/u and is on wait list Daughter states a refill is needed for memantine (NAMENDA) 10 MG tablet to  [facility]

## 2021-05-11 NOTE — Telephone Encounter (Signed)
Error

## 2021-07-19 ENCOUNTER — Other Ambulatory Visit: Payer: Self-pay | Admitting: Neurology

## 2021-07-19 ENCOUNTER — Telehealth: Payer: Self-pay | Admitting: Cardiology

## 2021-07-19 NOTE — Telephone Encounter (Signed)
?*  STAT* If patient is at the pharmacy, call can be transferred to refill team. ? ? ?1. Which medications need to be refilled? (please list name of each medication and dose if known)  ?atorvastatin (LIPITOR) 40 MG tablet ?lisinopril (ZESTRIL) 10 MG tablet ?acebutolol (SECTRAL) 200 MG capsule ? ?2. Which pharmacy/location (including street and city if local pharmacy) is medication to be sent to? [facility] [place], [place] ? ?3. Do they need a 30 day or 90 day supply? 90 day supply  ?

## 2021-07-20 MED ORDER — MEMANTINE HCL 10 MG PO TABS: ORAL_TABLET | ORAL | 0 refills | Status: DC

## 2021-07-22 ENCOUNTER — Other Ambulatory Visit: Payer: Self-pay | Admitting: Neurology

## 2021-07-25 MED ORDER — MEMANTINE HCL 10 MG PO TABS: ORAL_TABLET | ORAL | 0 refills | Status: DC

## 2021-07-26 ENCOUNTER — Other Ambulatory Visit: Payer: Self-pay

## 2021-07-26 MED ORDER — ACEBUTOLOL HCL 200 MG PO CAPS: 200.0000 mg | ORAL_CAPSULE | Freq: Every day | ORAL | 0 refills | Status: DC

## 2021-07-26 MED ORDER — ATORVASTATIN CALCIUM 40 MG PO TABS: 40.0000 mg | ORAL_TABLET | Freq: Every day | ORAL | 0 refills | Status: DC

## 2021-07-26 MED ORDER — LISINOPRIL 10 MG PO TABS: 10.0000 mg | ORAL_TABLET | Freq: Every day | ORAL | 0 refills | Status: DC

## 2021-08-15 ENCOUNTER — Encounter: Payer: Self-pay | Admitting: Oncology

## 2021-08-15 ENCOUNTER — Inpatient Hospital Stay: Payer: Medicare Other | Attending: Oncology | Admitting: Oncology

## 2021-08-15 VITALS — BP 153/69 | HR 57 | Temp 98.2°F | Resp 18 | Ht 61.0 in | Wt 120.4 lb

## 2021-08-15 DIAGNOSIS — Z170 Estrogen receptor positive status [ER+]: Secondary | ICD-10-CM | POA: Diagnosis not present

## 2021-08-15 DIAGNOSIS — C50912 Malignant neoplasm of unspecified site of left female breast: Secondary | ICD-10-CM | POA: Diagnosis not present

## 2021-08-15 DIAGNOSIS — C50911 Malignant neoplasm of unspecified site of right female breast: Secondary | ICD-10-CM

## 2021-08-15 DIAGNOSIS — C50919 Malignant neoplasm of unspecified site of unspecified female breast: Secondary | ICD-10-CM

## 2021-08-16 ENCOUNTER — Telehealth: Payer: Self-pay | Admitting: Oncology

## 2021-08-16 NOTE — Telephone Encounter (Signed)
Patient has been scheduled for follow-up visit per 08/15/21 los.  ? ?Appt information given to daughter over the phone. ?

## 2021-08-18 ENCOUNTER — Telehealth: Payer: Self-pay | Admitting: Cardiology

## 2021-08-18 MED ORDER — ACEBUTOLOL HCL 200 MG PO CAPS: 200.0000 mg | ORAL_CAPSULE | Freq: Every day | ORAL | 0 refills | Status: DC

## 2021-08-18 NOTE — Telephone Encounter (Signed)
Refill sent to pharmacy.   

## 2021-08-18 NOTE — Telephone Encounter (Signed)
?*  STAT* If patient is at the pharmacy, call can be transferred to refill team. ? ? ?1. Which medications need to be refilled? (please list name of each medication and dose if known)  ?acebutolol (SECTRAL) 200 MG capsule ? ?2. Which pharmacy/location (including street and city if local pharmacy) is medication to be sent to? ?[place], [place] ? ?3. Do they need a 30 day or 90 day supply?  ? 90 day supply ? ?

## 2021-08-26 DIAGNOSIS — C50919 Malignant neoplasm of unspecified site of unspecified female breast: Secondary | ICD-10-CM | POA: Insufficient documentation
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
## 2021-09-13 ENCOUNTER — Ambulatory Visit: Payer: Medicare HMO | Admitting: Neurology

## 2021-09-14 ENCOUNTER — Encounter: Payer: Self-pay | Admitting: Cardiology

## 2021-09-14 ENCOUNTER — Ambulatory Visit: Payer: Medicare Other | Admitting: Cardiology

## 2021-09-14 VITALS — BP 130/70 | HR 44 | Ht 59.0 in | Wt 119.2 lb

## 2021-09-14 DIAGNOSIS — I10 Essential (primary) hypertension: Secondary | ICD-10-CM

## 2021-09-14 DIAGNOSIS — I2510 Atherosclerotic heart disease of native coronary artery without angina pectoris: Secondary | ICD-10-CM | POA: Diagnosis not present

## 2021-09-14 DIAGNOSIS — R001 Bradycardia, unspecified: Secondary | ICD-10-CM

## 2021-09-14 DIAGNOSIS — I471 Supraventricular tachycardia: Secondary | ICD-10-CM | POA: Diagnosis not present

## 2021-09-14 MED ORDER — ATORVASTATIN CALCIUM 40 MG PO TABS: 40.0000 mg | ORAL_TABLET | Freq: Every day | ORAL | 3 refills | Status: DC

## 2021-09-14 MED ORDER — LISINOPRIL 10 MG PO TABS: 10.0000 mg | ORAL_TABLET | Freq: Every day | ORAL | 3 refills | Status: DC

## 2021-09-14 NOTE — Patient Instructions (Signed)
Medication Instructions:  ?Your physician recommends that you continue on your current medications as directed. Please refer to the Current Medication list given to you today.  ? ?Hold Acebutolol until seen in 6 weeks by Dr. Harriet Masson ?*If you need a refill on your cardiac medications before your next appointment, please call your pharmacy* ? ? ?Lab Work: ?None ?If you have labs (blood work) drawn today and your tests are completely normal, you will receive your results only by: ?MyChart Message (if you have MyChart) OR ?A paper copy in the mail ?If you have any lab test that is abnormal or we need to change your treatment, we will call you to review the results. ? ? ?Testing/Procedures: ?None ? ? ?Follow-Up: ?At [facility], you and your health needs are our priority.  As part of our continuing mission to provide you with exceptional heart care, we have created designated Provider Care Teams.  These Care Teams include your primary Cardiologist (physician) and Advanced Practice Providers (APPs -  Physician Assistants and Nurse Practitioners) who all work together to provide you with the care you need, when you need it. ? ?We recommend signing up for the patient portal called "MyChart".  Sign up information is provided on this After Visit Summary.  MyChart is used to connect with patients for Virtual Visits (Telemedicine).  Patients are able to view lab/test results, encounter notes, upcoming appointments, etc.  Non-urgent messages can be sent to your provider as well.   ?To learn more about what you can do with MyChart, go to [email or web address].   ? ?Your next appointment:   ?6 week(s) ? ?The format for your next appointment:   ?Virtual Visit  ? ?Provider:   ?Berniece Salines, DO   ? ? ?Other Instructions ? ? ?Important Information About Sugar ? ? ? ? ?  ?

## 2021-09-18 DIAGNOSIS — I4719 Other supraventricular tachycardia: Secondary | ICD-10-CM | POA: Insufficient documentation

## 2021-09-18 DIAGNOSIS — I471 Supraventricular tachycardia: Secondary | ICD-10-CM | POA: Insufficient documentation
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
## 2021-11-11 ENCOUNTER — Telehealth: Payer: Medicare Other | Admitting: Cardiology

## 2021-11-11 ENCOUNTER — Telehealth: Payer: Self-pay

## 2021-11-11 NOTE — Telephone Encounter (Signed)
Pt states that she did not know about her appt today and needed someone to help her navigate her virtual visit. She states that if it is not urgent she would like to reschedule for 6 weeks from now. Unable to reschedule on my end. Best callback - [phone]

## 2021-11-11 NOTE — Telephone Encounter (Signed)
Called preferred number, spoke with pt's family member. She is not with the pt at this time.

## 2021-11-11 NOTE — Telephone Encounter (Signed)
Called pt's cell [phone], given to me by pt's family member. No answer at this time. Left a message for her to return the call.

## 2021-11-15 ENCOUNTER — Telehealth: Payer: Self-pay | Admitting: Cardiology

## 2021-11-15 ENCOUNTER — Ambulatory Visit: Payer: Medicare HMO | Admitting: Neurology

## 2021-11-15 NOTE — Telephone Encounter (Signed)
Patient called and wanted to know if she can start taking acebutolol (SECTRAL) 200 MG capsule

## 2021-11-15 NOTE — Telephone Encounter (Signed)
OV with Dr. Harriet Masson 05/10: Heart rates in the low 40s today.  Given her heart rates in the 40s and she is having some symptoms I am going to hold her acebutolol for now and see if her symptoms and heart rate improves. We will continue her lisinopril. No anginal symptoms.   The patient is in agreement with the above plan. The patient left the office in stable condition.  The patient will follow up in 6 weeks virtually.     VV was cancelled by patient.      Left message for patient to call back to discuss and get rescheduled for follow up.

## 2021-11-17 NOTE — Telephone Encounter (Signed)
Left message to call back  

## 2021-11-17 NOTE — Telephone Encounter (Signed)
Daughter called wanting to follow-up on the patient's acebutolol (SECTRAL) 200 MG capsule medication status.

## 2021-11-21 NOTE — Telephone Encounter (Signed)
Left message for pt to call.

## 2021-11-28 NOTE — Telephone Encounter (Signed)
Left message for patient to call back to discuss.  Left call back number.   Third attempt- will remove from triage call back.

## 2021-12-19 ENCOUNTER — Other Ambulatory Visit: Payer: Self-pay | Admitting: Cardiology

## 2021-12-19 ENCOUNTER — Other Ambulatory Visit: Payer: Self-pay | Admitting: Neurology

## 2021-12-20 ENCOUNTER — Telehealth: Payer: Self-pay | Admitting: Cardiology

## 2021-12-20 NOTE — Telephone Encounter (Signed)
*  STAT* If patient is at the pharmacy, call can be transferred to refill team.   1. Which medications need to be refilled? (please list name of each medication and dose if known)   atorvastatin (LIPITOR) 40 MG tablet  lisinopril (ZESTRIL) 10 MG tablet  2. Which pharmacy/location (including street and city if local pharmacy) is medication to be sent to?  [id number]- [place],  - [id number]  3. Do they need a 30 day or 90 day supply?  90 days  Daughter called stating patient only has a few tablets of these medications left.

## 2021-12-21 MED ORDER — LISINOPRIL 10 MG PO TABS: 10.0000 mg | ORAL_TABLET | Freq: Every day | ORAL | 3 refills | Status: DC

## 2021-12-21 MED ORDER — ATORVASTATIN CALCIUM 40 MG PO TABS: 40.0000 mg | ORAL_TABLET | Freq: Every day | ORAL | 3 refills | Status: AC

## 2021-12-21 NOTE — Telephone Encounter (Signed)
Pt was to be seen 6 weeks from May 10th but she was not seen. Pt was to hold Acebutolol until she seen you. What would you like me to do?

## 2021-12-22 ENCOUNTER — Telehealth: Payer: Self-pay

## 2021-12-22 NOTE — Telephone Encounter (Signed)
Called pt to set up an appt. She was  to be seen 6 weeks after last appt (09/14/2021). No answer at this time let message letting pt know she needs to be seen in office before her medications can be refilled.

## 2022-03-08 ENCOUNTER — Ambulatory Visit: Payer: Medicare HMO | Admitting: Neurology
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
## 2022-03-09 ENCOUNTER — Encounter: Payer: Self-pay | Admitting: Neurology

## 2022-03-09 ENCOUNTER — Ambulatory Visit (INDEPENDENT_AMBULATORY_CARE_PROVIDER_SITE_OTHER): Payer: Medicare Other | Admitting: Neurology

## 2022-03-09 VITALS — BP 140/59 | HR 49 | Ht 59.0 in | Wt 115.0 lb

## 2022-03-09 DIAGNOSIS — F03A0 Unspecified dementia, mild, without behavioral disturbance, psychotic disturbance, mood disturbance, and anxiety: Secondary | ICD-10-CM | POA: Diagnosis not present

## 2022-03-09 DIAGNOSIS — R413 Other amnesia: Secondary | ICD-10-CM

## 2022-03-09 DIAGNOSIS — F0390 Unspecified dementia without behavioral disturbance: Secondary | ICD-10-CM | POA: Insufficient documentation

## 2022-03-09 MED ORDER — MEMANTINE HCL 10 MG PO TABS: ORAL_TABLET | ORAL | 3 refills | Status: AC

## 2022-03-09 NOTE — Patient Instructions (Signed)
Continue Namenda for memory  I have concerns about driving, encourage family to closely monitor for safety  Recommend exercise, brain stimulating exercises, healthy eating, drink plenty of water See you back in 6 months

## 2022-03-15 ENCOUNTER — Telehealth: Payer: Self-pay | Admitting: Cardiology

## 2022-03-15 NOTE — Telephone Encounter (Signed)
Please advise on how to proceed?   Last OV: Hold Acebutolol until seen in 6 weeks by Dr. Harriet Masson    Patient had an appointment on 11/11/21 but "left without being seen".  Do you want her to come in and be seen to restart?   Thanks!

## 2022-03-15 NOTE — Telephone Encounter (Signed)
Pt c/o medication issue:  1. Name of Medication: acebutolol (SECTRAL) 200 MG capsule   2. How are you currently taking this medication (dosage and times per day)? Not currently taking  3. Are you having a reaction (difficulty breathing--STAT)? No   4. What is your medication issue? Patient was advised to stop taking this medication in May and a f/u appt was never had to discuss restarting. She is wanting to know if patient should begin taking again. Please advise.

## 2022-03-22 ENCOUNTER — Other Ambulatory Visit: Payer: Self-pay | Admitting: Cardiology

## 2022-04-07 NOTE — Telephone Encounter (Signed)
Spoke to patient's daughter Alyce she was calling to make sure mother is not suppose to take Acebutolol.Stated she has been doing good.After reviewing chart Dr.Tobb stopped Acebutolol.I will make Dr.Tobb aware.

## 2022-04-07 NOTE — Telephone Encounter (Signed)
Patient's daughter calling back to find out if the patient needs to start the acebutolol again. She says they also need to know what the medication is for.

## 2022-05-17 ENCOUNTER — Ambulatory Visit: Payer: BLUE CROSS/BLUE SHIELD | Admitting: Cardiology

## 2022-08-09 ENCOUNTER — Telehealth: Payer: Self-pay | Admitting: Neurology

## 2022-08-09 NOTE — Telephone Encounter (Signed)
LVM and sent mychart msg informing pt of need to reschedule 09/12/22 appointment - NP out
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
## 2022-08-16 ENCOUNTER — Inpatient Hospital Stay: Payer: Medicare Other | Attending: Oncology | Admitting: Oncology

## 2022-08-16 ENCOUNTER — Encounter: Payer: Self-pay | Admitting: Oncology

## 2022-08-16 VITALS — BP 149/67 | HR 69 | Temp 98.6°F | Resp 16 | Ht 59.0 in | Wt 114.7 lb

## 2022-08-16 DIAGNOSIS — C50919 Malignant neoplasm of unspecified site of unspecified female breast: Secondary | ICD-10-CM

## 2022-09-12 ENCOUNTER — Ambulatory Visit: Payer: Medicare Other | Admitting: Neurology

## 2022-11-15 ENCOUNTER — Other Ambulatory Visit: Payer: Medicare Other

## 2022-11-16 ENCOUNTER — Other Ambulatory Visit: Payer: Self-pay | Admitting: "Endocrinology

## 2022-11-16 ENCOUNTER — Other Ambulatory Visit (INDEPENDENT_AMBULATORY_CARE_PROVIDER_SITE_OTHER): Payer: Medicare Other

## 2022-11-16 ENCOUNTER — Other Ambulatory Visit: Payer: Self-pay

## 2022-11-16 DIAGNOSIS — E0590 Thyrotoxicosis, unspecified without thyrotoxic crisis or storm: Secondary | ICD-10-CM

## 2022-11-16 DIAGNOSIS — R634 Abnormal weight loss: Secondary | ICD-10-CM

## 2022-11-16 LAB — T3, FREE: T3, Free: 2.6 pg/mL (ref 2.3–4.2)

## 2022-11-16 LAB — BASIC METABOLIC PANEL
BUN: 26 mg/dL — ABNORMAL HIGH (ref 6–23)
CO2: 28 mEq/L (ref 19–32)
Calcium: 9.3 mg/dL (ref 8.4–10.5)
Chloride: 104 mEq/L (ref 96–112)
Creatinine, Ser: 0.95 mg/dL (ref 0.40–1.20)
GFR: 54.32 mL/min — ABNORMAL LOW (ref >60.00)
Glucose, Bld: 94 mg/dL (ref 70–99)
Potassium: 4.2 mEq/L (ref 3.5–5.1)
Sodium: 140 mEq/L (ref 135–145)

## 2022-11-16 LAB — T4, FREE: Free T4: 0.86 ng/dL (ref 0.60–1.60)

## 2022-11-16 LAB — TSH: TSH: 0.59 u[IU]/mL (ref 0.35–5.50)

## 2022-11-20 ENCOUNTER — Encounter: Payer: Self-pay | Admitting: "Endocrinology

## 2022-11-20 ENCOUNTER — Ambulatory Visit: Payer: Medicare Other | Admitting: "Endocrinology

## 2022-11-20 VITALS — BP 140/90 | HR 67 | Ht 59.0 in | Wt 117.6 lb

## 2022-11-20 DIAGNOSIS — Z8639 Personal history of other endocrine, nutritional and metabolic disease: Secondary | ICD-10-CM | POA: Diagnosis not present

## 2022-11-20 DIAGNOSIS — R634 Abnormal weight loss: Secondary | ICD-10-CM

## 2022-11-20 LAB — TRAB (TSH RECEPTOR BINDING ANTIBODY): TRAB: <1 IU/L (ref <=2.00)

## 2022-11-20 LAB — THYROID STIMULATING IMMUNOGLOBULIN: TSI: <89 % baseline (ref <140)
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
## 2023-01-31 ENCOUNTER — Other Ambulatory Visit: Payer: Self-pay | Admitting: Oncology

## 2023-01-31 ENCOUNTER — Telehealth: Payer: Self-pay

## 2023-01-31 DIAGNOSIS — C23 Malignant neoplasm of gallbladder: Secondary | ICD-10-CM | POA: Insufficient documentation

## 2023-01-31 NOTE — Telephone Encounter (Signed)
-----   Message from Dellia Beckwith sent at 01/31/2023  1:14 PM EDT ----- Regarding: RE: new diagnosis I hate to say no, this is [name] Tipton's mother.  How about 11:30 am on 10/2, and we may run a little late into lunch ----- Message ----- From: Jeannette Corpus, LPN Sent: 06/07/8655  10:31 AM EDT To: Dellia Beckwith, MD Subject: new diagnosis                                  Damaris got a new referral on this patient. 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 We don't have any openings until late October. Do you want her to work her in or schedule with Dr. Angelene Giovanni?

## 2023-01-31 NOTE — Telephone Encounter (Signed)
10/2 at 1130 already taken. We can either do 10/1 at 4pm or 10/2 at 3:30pm after the other re-established patient at 2:30pm. Just let me.

## 2023-02-07 ENCOUNTER — Inpatient Hospital Stay: Payer: Medicare Other | Attending: Oncology | Admitting: Oncology

## 2023-02-07 ENCOUNTER — Other Ambulatory Visit: Payer: Self-pay | Admitting: Oncology

## 2023-02-07 ENCOUNTER — Encounter: Payer: Self-pay | Admitting: Oncology

## 2023-02-07 ENCOUNTER — Inpatient Hospital Stay: Payer: Medicare Other

## 2023-02-07 VITALS — BP 148/71 | HR 66 | Temp 98.1°F | Resp 18 | Ht 59.0 in | Wt 110.4 lb

## 2023-02-07 DIAGNOSIS — Z1501 Genetic susceptibility to malignant neoplasm of breast: Secondary | ICD-10-CM | POA: Diagnosis not present

## 2023-02-07 DIAGNOSIS — D649 Anemia, unspecified: Secondary | ICD-10-CM | POA: Insufficient documentation

## 2023-02-07 DIAGNOSIS — Z1509 Genetic susceptibility to other malignant neoplasm: Secondary | ICD-10-CM | POA: Insufficient documentation

## 2023-02-07 DIAGNOSIS — F0280 Dementia in other diseases classified elsewhere without behavioral disturbance: Secondary | ICD-10-CM | POA: Insufficient documentation

## 2023-02-07 DIAGNOSIS — Z803 Family history of malignant neoplasm of breast: Secondary | ICD-10-CM | POA: Insufficient documentation

## 2023-02-07 DIAGNOSIS — C23 Malignant neoplasm of gallbladder: Secondary | ICD-10-CM

## 2023-02-07 DIAGNOSIS — Z853 Personal history of malignant neoplasm of breast: Secondary | ICD-10-CM | POA: Diagnosis not present

## 2023-02-07 DIAGNOSIS — Z9013 Acquired absence of bilateral breasts and nipples: Secondary | ICD-10-CM | POA: Insufficient documentation

## 2023-02-07 LAB — CMP (CANCER CENTER ONLY)
ALT: 15 U/L (ref 0–44)
AST: 24 U/L (ref 15–41)
Albumin: 3.8 g/dL (ref 3.5–5.0)
Alkaline Phosphatase: 106 U/L (ref 38–126)
Anion gap: 9 (ref 5–15)
BUN: 18 mg/dL (ref 8–23)
CO2: 27 mmol/L (ref 22–32)
Calcium: 9.2 mg/dL (ref 8.9–10.3)
Chloride: 105 mmol/L (ref 98–111)
Creatinine: 0.92 mg/dL (ref 0.44–1.00)
GFR, Estimated: >60 mL/min (ref >60)
Glucose, Bld: 99 mg/dL (ref 70–99)
Potassium: 3 mmol/L — ABNORMAL LOW (ref 3.5–5.1)
Sodium: 141 mmol/L (ref 135–145)
Total Bilirubin: 0.7 mg/dL (ref 0.3–1.2)
Total Protein: 6.9 g/dL (ref 6.5–8.1)

## 2023-02-07 LAB — CBC WITH DIFFERENTIAL (CANCER CENTER ONLY)
Abs Immature Granulocytes: 0.01 10*3/uL (ref 0.00–0.07)
Basophils Absolute: 0 10*3/uL (ref 0.0–0.1)
Basophils Relative: 1 %
Eosinophils Absolute: 0.1 10*3/uL (ref 0.0–0.5)
Eosinophils Relative: 2 %
HCT: 35.3 % — ABNORMAL LOW (ref 36.0–46.0)
Hemoglobin: 10.7 g/dL — ABNORMAL LOW (ref 12.0–15.0)
Immature Granulocytes: 0 %
Lymphocytes Relative: 28 %
Lymphs Abs: 1.2 10*3/uL (ref 0.7–4.0)
MCH: 29.1 pg (ref 26.0–34.0)
MCHC: 30.3 g/dL (ref 30.0–36.0)
MCV: 95.9 fL (ref 80.0–100.0)
Monocytes Absolute: 0.3 10*3/uL (ref 0.1–1.0)
Monocytes Relative: 6 %
Neutro Abs: 2.7 10*3/uL (ref 1.7–7.7)
Neutrophils Relative %: 63 %
Platelet Count: 251 10*3/uL (ref 150–400)
RBC: 3.68 MIL/uL — ABNORMAL LOW (ref 3.87–5.11)
RDW: 14.7 % (ref 11.5–15.5)
WBC Count: 4.3 10*3/uL (ref 4.0–10.5)
nRBC: 0 % (ref 0.0–0.2)

## 2023-02-08 LAB — CANCER ANTIGEN 19-9: CA 19-9: <2 U/mL (ref 0–35)

## 2023-02-08 LAB — AFP TUMOR MARKER: AFP, Serum, Tumor Marker: 3.5 ng/mL (ref 0.0–8.7)

## 2023-02-08 LAB — CEA: CEA: 2.5 ng/mL (ref 0.0–4.7)

## 2023-02-22 ENCOUNTER — Other Ambulatory Visit: Payer: Self-pay | Admitting: Oncology

## 2023-02-22 DIAGNOSIS — E876 Hypokalemia: Secondary | ICD-10-CM | POA: Insufficient documentation

## 2023-02-22 MED ORDER — POTASSIUM CHLORIDE CRYS ER 20 MEQ PO TBCR
20.0000 meq | EXTENDED_RELEASE_TABLET | Freq: Two times a day (BID) | ORAL | 5 refills | Status: DC
Start: 2023-02-22 — End: 2023-08-25
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
## 2023-02-23 ENCOUNTER — Telehealth: Payer: Self-pay

## 2023-02-23 NOTE — Telephone Encounter (Signed)
-----   Message from Dellia Beckwith sent at 02/22/2023  8:04 PM EDT ----- Regarding: call Call her daughter Wanda Murillo  [phone]  and tell her I am sorry to take so long to review Wanda Murillo's labs. Her K is quite low and she needs to get on supplement of 20 meq bid, I will send in.  The rest is good but her hgb is 10.7, we know she was anemic in the hospital (10.1 last month) so we will see how she does on the iron and B12 supp. All of the cancer markers are normal.  I am seeing her in 6 months but will need to have this rechecked sooner than that.  Is she seeing Dr. Sol Passer in the next few months? Or we can arange to check here.  Send him copy of labs.

## 2023-02-23 NOTE — Telephone Encounter (Signed)
Alyce notified of patients labs results. Daughter will pick up supplement today.

## 2023-03-19 ENCOUNTER — Encounter: Payer: Self-pay | Admitting: Emergency Medicine
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
## 2023-05-24 ENCOUNTER — Ambulatory Visit: Payer: Medicare Other | Admitting: Neurology

## 2023-08-16 ENCOUNTER — Inpatient Hospital Stay: Payer: Medicare Other | Admitting: Oncology

## 2023-08-16 ENCOUNTER — Inpatient Hospital Stay: Payer: Medicare Other
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
## 2023-08-23 ENCOUNTER — Inpatient Hospital Stay

## 2023-08-23 ENCOUNTER — Inpatient Hospital Stay: Admitting: Hematology and Oncology

## 2023-08-25 ENCOUNTER — Other Ambulatory Visit: Payer: Self-pay | Admitting: Oncology

## 2023-08-25 DIAGNOSIS — E876 Hypokalemia: Secondary | ICD-10-CM
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
## 2023-08-28 ENCOUNTER — Inpatient Hospital Stay: Admitting: Hematology and Oncology

## 2023-08-28 ENCOUNTER — Inpatient Hospital Stay
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
## 2023-12-06 ENCOUNTER — Ambulatory Visit: Payer: Medicare Other | Admitting: Neurology

## 2024-01-10 ENCOUNTER — Ambulatory Visit (HOSPITAL_BASED_OUTPATIENT_CLINIC_OR_DEPARTMENT_OTHER): Admit: 2024-01-10 | Discharge: 2024-01-10 | Disposition: A | Discharge status: Home / Self Care | Admitting: Radiology

## 2024-01-10 ENCOUNTER — Ambulatory Visit (HOSPITAL_BASED_OUTPATIENT_CLINIC_OR_DEPARTMENT_OTHER): Admission: EM | Admit: 2024-01-10 | Discharge: 2024-01-10 | Disposition: A | Discharge status: Home / Self Care

## 2024-01-10 ENCOUNTER — Encounter (HOSPITAL_BASED_OUTPATIENT_CLINIC_OR_DEPARTMENT_OTHER): Payer: Self-pay

## 2024-01-10 DIAGNOSIS — M545 Low back pain, unspecified: Secondary | ICD-10-CM | POA: Diagnosis not present

## 2024-01-10 NOTE — Discharge Instructions (Signed)
 No new concerns on the xray. There are some compression fractures that appear to be chronic.  Recommend stretching the area and heat.  Can take tylenol  as needed for pain.  See PCP for continued issues.

## 2024-01-10 NOTE — ED Triage Notes (Signed)
 Pt c/o left lower back pain for the last week. Pt denies known injury. Pain is constant and increases when she walks. Pain is described as an ache or soreness. Pt has taken aspirin, ibuprofen, and lidocaine  patches with some relief.
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
## 2024-02-25 ENCOUNTER — Other Ambulatory Visit: Payer: Self-pay

## 2024-02-25 DIAGNOSIS — E876 Hypokalemia: Secondary | ICD-10-CM

## 2024-02-25 MED ORDER — POTASSIUM CHLORIDE CRYS ER 20 MEQ PO TBCR: 20.0000 meq | EXTENDED_RELEASE_TABLET | Freq: Two times a day (BID) | ORAL | 1 refills | Status: AC
